# Patient Record
Sex: Male | Born: 1950 | Race: White | Hispanic: No | Marital: Married
Health system: Midwestern US, Academic
[De-identification: ages and names within clinical notes are randomized; demographics above are authoritative.]

---

## 2016-11-10 MED ORDER — CLOPIDOGREL 75 MG PO TAB
75 mg | ORAL_TABLET | Freq: Every day | ORAL | 3 refills | 90.00000 days | Status: DC
Start: 2016-11-10 — End: 2017-04-19

## 2016-11-23 MED ORDER — ACETAMINOPHEN 325 MG PO TAB
650 mg | ORAL | 0 refills | Status: DC | PRN
Start: 2016-11-23 — End: 2016-11-24

## 2016-11-23 MED ORDER — DIPHENHYDRAMINE HCL 50 MG/ML IJ SOLN
25 mg | INTRAVENOUS | 0 refills | Status: DC | PRN
Start: 2016-11-23 — End: 2016-11-24

## 2016-11-23 MED ORDER — NITROGLYCERIN 0.4 MG SL SUBL
.4 mg | SUBLINGUAL | 0 refills | Status: DC | PRN
Start: 2016-11-23 — End: 2016-11-24

## 2016-11-23 MED ORDER — ONDANSETRON HCL (PF) 4 MG/2 ML IJ SOLN
4 mg | INTRAVENOUS | 0 refills | Status: DC | PRN
Start: 2016-11-23 — End: 2016-11-24

## 2016-11-23 MED ORDER — ALUMINUM-MAGNESIUM HYDROXIDE 200-200 MG/5 ML PO SUSP
30 mL | ORAL | 0 refills | Status: DC | PRN
Start: 2016-11-23 — End: 2016-11-24

## 2016-11-23 MED ORDER — SODIUM CHLORIDE 0.9 % IV SOLP
INTRAVENOUS | 0 refills | Status: DC
Start: 2016-11-23 — End: 2016-11-24

## 2016-11-23 MED ORDER — ASPIRIN 325 MG PO TAB
325 mg | Freq: Once | ORAL | 0 refills | Status: DC
Start: 2016-11-23 — End: 2016-11-24

## 2016-11-23 MED ORDER — DIPHENHYDRAMINE HCL 25 MG PO CAP
25 mg | ORAL | 0 refills | Status: DC | PRN
Start: 2016-11-23 — End: 2016-11-24

## 2016-11-27 MED ORDER — CEFAZOLIN INJ 1GM IVP
2 g | Freq: Once | INTRAVENOUS | 0 refills | Status: CN
Start: 2016-11-27 — End: ?

## 2016-12-04 MED ORDER — PROPOFOL INJ 10 MG/ML IV VIAL
0 refills | Status: DC
Start: 2016-12-04 — End: 2016-12-04

## 2016-12-04 MED ORDER — HALOPERIDOL LACTATE 5 MG/ML IJ SOLN
1 mg | Freq: Once | INTRAVENOUS | 0 refills | Status: DC | PRN
Start: 2016-12-04 — End: 2016-12-04

## 2016-12-04 MED ORDER — LACTATED RINGERS IV SOLP
1000 mL | INTRAVENOUS | 0 refills | Status: DC
Start: 2016-12-04 — End: 2016-12-05

## 2016-12-04 MED ORDER — HEPARIN (PORCINE) 1,000 UNIT/ML IJ SOLN
0 refills | Status: DC
Start: 2016-12-04 — End: 2016-12-04

## 2016-12-04 MED ORDER — FENTANYL CITRATE (PF) 50 MCG/ML IJ SOLN
25 ug | INTRAVENOUS | 0 refills | Status: DC | PRN
Start: 2016-12-04 — End: 2016-12-04

## 2016-12-04 MED ORDER — ASPIRIN 325 MG PO TBEC
325 mg | Freq: Every evening | ORAL | 0 refills | Status: DC
Start: 2016-12-04 — End: 2016-12-06

## 2016-12-04 MED ORDER — CLOPIDOGREL 75 MG PO TAB
75 mg | Freq: Every day | ORAL | 0 refills | Status: DC
Start: 2016-12-04 — End: 2016-12-06

## 2016-12-04 MED ORDER — HEPARIN 5,000 UNITS IN LR 500 ML IRR
Freq: Once | 0 refills | Status: AC
Start: 2016-12-04 — End: ?

## 2016-12-04 MED ORDER — HYDROMORPHONE (PF) 2 MG/ML IJ SYRG
.5-1 mg | INTRAVENOUS | 0 refills | Status: DC | PRN
Start: 2016-12-04 — End: 2016-12-04

## 2016-12-04 MED ORDER — DEXAMETHASONE SODIUM PHOSPHATE 4 MG/ML IJ SOLN
INTRAVENOUS | 0 refills | Status: DC
Start: 2016-12-04 — End: 2016-12-04

## 2016-12-04 MED ORDER — PHENYLEPHRINE IN 0.9% NACL(PF) 1 MG/10 ML (100 MCG/ML) IV SYRG
INTRAVENOUS | 0 refills | Status: DC
Start: 2016-12-04 — End: 2016-12-04

## 2016-12-04 MED ORDER — ROSUVASTATIN 20 MG PO TAB
40 mg | Freq: Every evening | ORAL | 0 refills | Status: DC
Start: 2016-12-04 — End: 2016-12-06

## 2016-12-04 MED ORDER — FENTANYL CITRATE (PF) 50 MCG/ML IJ SOLN
25-50 ug | INTRAVENOUS | 0 refills | Status: DC | PRN
Start: 2016-12-04 — End: 2016-12-06

## 2016-12-04 MED ORDER — PROMETHAZINE 25 MG/ML IJ SOLN
6.25 mg | INTRAVENOUS | 0 refills | Status: DC | PRN
Start: 2016-12-04 — End: 2016-12-04

## 2016-12-04 MED ORDER — LIDOCAINE (PF) 10 MG/ML (1 %) IJ SOLN
.1-2 mL | INTRAMUSCULAR | 0 refills | Status: DC | PRN
Start: 2016-12-04 — End: 2016-12-06

## 2016-12-04 MED ORDER — DIPHENHYDRAMINE HCL 50 MG/ML IJ SOLN
25 mg | Freq: Once | INTRAVENOUS | 0 refills | Status: DC | PRN
Start: 2016-12-04 — End: 2016-12-04

## 2016-12-04 MED ORDER — BUPROPION XL 150 MG PO TB24
150 mg | Freq: Every day | ORAL | 0 refills | Status: DC
Start: 2016-12-04 — End: 2016-12-06

## 2016-12-04 MED ORDER — ENOXAPARIN 40 MG/0.4 ML SC SYRG
40 mg | Freq: Every day | SUBCUTANEOUS | 0 refills | Status: DC
Start: 2016-12-04 — End: 2016-12-06

## 2016-12-04 MED ORDER — ROCURONIUM 10 MG/ML IV SOLN
INTRAVENOUS | 0 refills | Status: DC
Start: 2016-12-04 — End: 2016-12-04

## 2016-12-04 MED ORDER — HYDROCODONE-ACETAMINOPHEN 5-325 MG PO TAB
1-2 | ORAL | 0 refills | Status: DC | PRN
Start: 2016-12-04 — End: 2016-12-06

## 2016-12-04 MED ORDER — LIDOCAINE (PF) 200 MG/10 ML (2 %) IJ SYRG
0 refills | Status: DC
Start: 2016-12-04 — End: 2016-12-04

## 2016-12-04 MED ORDER — FENTANYL CITRATE (PF) 50 MCG/ML IJ SOLN
0 refills | Status: DC
Start: 2016-12-04 — End: 2016-12-04

## 2016-12-04 MED ORDER — HEPARIN 5,000 UNITS IN LR 500 ML IRR
0 refills | Status: DC
Start: 2016-12-04 — End: 2016-12-04

## 2016-12-04 MED ORDER — PROTAMINE 10 MG/ML IV SOLN
0 refills | Status: DC
Start: 2016-12-04 — End: 2016-12-04

## 2016-12-04 MED ORDER — SUGAMMADEX 100 MG/ML IV SOLN
INTRAVENOUS | 0 refills | Status: DC
Start: 2016-12-04 — End: 2016-12-04

## 2016-12-04 MED ORDER — CEFAZOLIN 1GM NS IRR
0 refills | Status: DC
Start: 2016-12-04 — End: 2016-12-04

## 2016-12-04 MED ORDER — EPHEDRINE SULFATE 50 MG/ML IJ SOLN
0 refills | Status: DC
Start: 2016-12-04 — End: 2016-12-04

## 2016-12-04 MED ORDER — HYDROMORPHONE (PF) 2 MG/ML IJ SYRG
.5 mg | INTRAVENOUS | 0 refills | Status: DC | PRN
Start: 2016-12-04 — End: 2016-12-04

## 2016-12-04 MED ORDER — MIDAZOLAM 1 MG/ML IJ SOLN
INTRAVENOUS | 0 refills | Status: DC
Start: 2016-12-04 — End: 2016-12-04

## 2016-12-04 MED ORDER — ONDANSETRON HCL (PF) 4 MG/2 ML IJ SOLN
INTRAVENOUS | 0 refills | Status: DC
Start: 2016-12-04 — End: 2016-12-04

## 2016-12-04 MED ORDER — CEFAZOLIN INJ 1GM IVP
2 g | Freq: Once | INTRAVENOUS | 0 refills | Status: CP
Start: 2016-12-04 — End: ?

## 2016-12-06 MED ORDER — HYDROCODONE-ACETAMINOPHEN 5-325 MG PO TAB
1-2 | ORAL_TABLET | ORAL | 0 refills | 30.00000 days | Status: DC | PRN
Start: 2016-12-06 — End: 2016-12-11

## 2016-12-06 MED ORDER — BUPROPION SR 150 MG PO TBSR
150 mg | Freq: Every day | ORAL | 0 refills | Status: DC
Start: 2016-12-06 — End: 2018-11-12

## 2016-12-11 MED ORDER — HYDROCODONE-ACETAMINOPHEN 5-325 MG PO TAB
1-2 | ORAL_TABLET | ORAL | 0 refills | 15.00000 days | Status: DC | PRN
Start: 2016-12-11 — End: 2016-12-21

## 2016-12-11 MED ORDER — CEPHALEXIN 500 MG PO CAP
500 mg | ORAL_CAPSULE | Freq: Four times a day (QID) | ORAL | 0 refills | Status: AC
Start: 2016-12-11 — End: ?

## 2016-12-21 MED ORDER — HYDROCODONE-ACETAMINOPHEN 5-325 MG PO TAB
1 | ORAL_TABLET | ORAL | 0 refills | 30.00000 days | Status: DC | PRN
Start: 2016-12-21 — End: 2017-01-19

## 2017-01-19 ENCOUNTER — Encounter: Admit: 2017-01-19 | Discharge: 2017-01-19 | Payer: Commercial Managed Care - Pharmacy Benefit Manager

## 2017-01-19 ENCOUNTER — Ambulatory Visit: Admit: 2017-01-19 | Discharge: 2017-01-19 | Payer: BC Managed Care – PPO

## 2017-01-19 DIAGNOSIS — I739 Peripheral vascular disease, unspecified: Principal | ICD-10-CM

## 2017-01-19 DIAGNOSIS — Z951 Presence of aortocoronary bypass graft: ICD-10-CM

## 2017-01-19 DIAGNOSIS — E785 Hyperlipidemia, unspecified: ICD-10-CM

## 2017-01-19 DIAGNOSIS — R0600 Dyspnea, unspecified: ICD-10-CM

## 2017-01-19 DIAGNOSIS — R251 Tremor, unspecified: Principal | ICD-10-CM

## 2017-01-19 MED ORDER — HYDROCODONE-ACETAMINOPHEN 5-325 MG PO TAB
1 | ORAL_TABLET | ORAL | 0 refills | 30.00000 days | Status: AC | PRN
Start: 2017-01-19 — End: 2018-11-12
  Filled 2017-01-19 (×2): qty 25, 7d supply, fill #1

## 2017-01-19 NOTE — Progress Notes
Date of Service: 01/19/2017     Subjective:             Mitchell Krause is a 66 y.o. male.    History of Present Illness    ???  34M with CAD w/ inferior MI s/p CABG x5 in 2000, HTN, HLD, depression, active tobaccoism, and PVD w/ LLE claudication s/p AARO w/ Dr. Chales Abrahams 11/23/16 . Patient admitted 12/04/16 and underwent a Left proximal popliteal endarterectomy. Left distal popliteal artery and anterior tibial artery endarterectomy. Left proximal popliteal to tibioperoneal trunk bypass using non-reversed great saphenous vein graft for peripheral arterial disease with severely limiting left leg pain. He was evaluated and the left distal portion of the 3rd incision has a small amount of serous sang drainage.  He denies fevers or chills, SOB or CP. He is still having quite a bit of nerve pain. He says it is causing him to not sleep at night. He is walking as much as he can. The nerve pain can last as long as couple hours to 20 minutes. He find relief by walking or ice; or pain medication.      Review of Systems   HENT: Negative.    Eyes: Negative.    Respiratory: Negative.    Cardiovascular: Negative.    Gastrointestinal: Negative.    Endocrine: Negative.    Genitourinary: Negative.    Musculoskeletal: Positive for neck pain.   Skin: Negative.    Allergic/Immunologic: Negative.    Neurological: Positive for weakness and numbness.   Hematological: Bruises/bleeds easily.   Psychiatric/Behavioral: Negative.    All other systems reviewed and are negative.      Past Medical History:   Diagnosis Date   ??? Dyspnea    ??? History of five vessel coronary artery bypass    ??? Hyperlipidemia    ??? Tremor 09/01/2010    Left hand       Past Surgical History:   Procedure Laterality Date   ??? CORONARY ARTERY BYPASS GRAFT  2000    x5   ??? BYPASS GRAFT Left 12/04/2016    LEFT POPLITEAL ENDARTERECTOMY, LEFT ANTERIOR TIBIAL ENDARTERECTOMY, LEFT PROXIMAL POPLITEAL TO TIBIAL PERINEAL TRUNK BYPASS performed by Gladstone Lighter, MD [facility] ??? ABDOMINAL HERNIA REPAIR     ??? HX EYE SURGERY Left     Orbit replaced due to eye injury   ??? HX KNEE SURGERY Right    ??? HX ROTATOR CUFF REPAIR Left        Family History   Problem Relation Age of Onset   ??? High Cholesterol Father          Social History:   reports that he has been smoking Cigarettes.  He has a 21.50 pack-year smoking history. He has never used smokeless tobacco. He reports that he drinks about 1.2 oz of alcohol per week . He reports that he does not use drugs.      Objective:         ??? aspirin EC 325 mg tablet Take 325 mg by mouth at bedtime daily. Take with food.    ??? buPROPion SR(+) (WELLBUTRIN SR) 150 mg tablet Take 1 tablet by mouth daily.   ??? clopiDOGrel (PLAVIX) 75 mg tablet Take 1 tablet by mouth daily.   ??? Omega-3 Fatty Acids-Vitamin E (FISH OIL) 1,000 mg Cap Take 1 capsule by mouth at bedtime daily.   ??? potassium gluconate 600 mg (99 mg) tab Take 1 tablet by mouth every 72 hours.   ???  rosuvastatin (CRESTOR) 40 mg tablet Take once daily or as tolerated. (Patient taking differently: Take 40 mg by mouth at bedtime daily.)     Vitals:    01/19/17 0836   BP: 139/90   Pulse: 70   Resp: 14   Temp: 36.4 ???C (97.6 ???F)   TempSrc: Oral   Weight: 106.5 kg (234 lb 12.8 oz)   Height: 182.9 cm (72.01)     Body mass index is 31.84 kg/m???.     Physical Exam   Constitutional: He is oriented to person, place, and time. He appears well-developed and well-nourished.   Cardiovascular: Normal rate, regular rhythm, normal heart sounds and intact distal pulses.    Pulses:       Femoral pulses are 2+ on the right side, and 2+ on the left side.  Strong palpable graft pulse    Pulmonary/Chest: Effort normal and breath sounds normal.   Neurological: He is alert and oriented to person, place, and time. He has normal strength. GCS eye subscore is 4. GCS verbal subscore is 5. GCS motor subscore is 6.   Skin: Skin is warm and dry. Capillary refill takes less than 2 seconds. Psychiatric: He has a normal mood and affect. His speech is normal and behavior is normal. Thought content normal.   Nursing note and vitals reviewed.           Assessment and Plan:    [name] is a 66 y/o male that presents for follow up for LLE pop to tibio trunk bypass on 12/04/16. He is doing well. Graft [place] and ABIS done today; US revealed patent graft. Biphasic wave forms present; ABIS much improved since intervention L 1.01/0.77 R 1.06/0.61 compared to R 1.07/0.63 L 0.63/0.41. His left pain has resolved. He is still having quite a bit of nerve pain that extends to his foot. This is getting less and less. He is to continue asa and plavix and walking as tolerated. Some pain medication prescribed today; discussed weaning as tolerated. Will discuss nerve pain with Dr. Hollie Beach and see if there are any other alternatives. Would recommend ice as tolerated.       Suzan Slick, APRN  [facility] Services   Pager: [phone]

## 2017-04-19 ENCOUNTER — Encounter: Admit: 2017-04-19 | Discharge: 2017-04-19 | Payer: Commercial Managed Care - Pharmacy Benefit Manager

## 2017-04-19 ENCOUNTER — Ambulatory Visit: Admit: 2017-04-19 | Discharge: 2017-04-19 | Payer: BC Managed Care – PPO

## 2017-04-19 DIAGNOSIS — E7800 Pure hypercholesterolemia, unspecified: ICD-10-CM

## 2017-04-19 DIAGNOSIS — R251 Tremor, unspecified: Principal | ICD-10-CM

## 2017-04-19 DIAGNOSIS — E785 Hyperlipidemia, unspecified: ICD-10-CM

## 2017-04-19 DIAGNOSIS — I739 Peripheral vascular disease, unspecified: Principal | ICD-10-CM

## 2017-04-19 DIAGNOSIS — Z951 Presence of aortocoronary bypass graft: ICD-10-CM

## 2017-04-19 DIAGNOSIS — R0600 Dyspnea, unspecified: ICD-10-CM

## 2017-04-19 NOTE — Progress Notes
Date of Service: 04/19/2017     Subjective:             Mitchell Krause is a 66 y.o. male.    History of Present Illness  Mitchell Krause is a 66 y.o. male with a pmh of CAD s/p CABG, PVD s/p LLE pop to tibio trunk bypass on 12/04/16 who presents for 3 month f/u. He is having shooting radicular pain down his medial thigh, but is otherwise doing well. He is able to walk at least a mile and a half without claudication. He is taking ASA and is currently smoking. He stopped his plavix because he was bruising so significantly. Graft [place] and ABI's were done today and were within normal limits. He has had a cold, but no fevers.        Review of Systems   Constitutional: Negative.    HENT: Negative.    Eyes: Negative.    Respiratory: Negative.    Cardiovascular: Negative.    Gastrointestinal: Negative.    Endocrine: Negative.    Genitourinary: Negative.    Musculoskeletal: Negative.    Skin: Negative.    Allergic/Immunologic: Negative.    Neurological: Positive for numbness.   Hematological: Negative.    Psychiatric/Behavioral: Negative.    All other systems reviewed and are negative.      Past Medical History:   Diagnosis Date   ??? Dyspnea    ??? History of five vessel coronary artery bypass    ??? Hyperlipidemia    ??? Tremor 09/01/2010    Left hand       Past Surgical History:   Procedure Laterality Date   ??? CORONARY ARTERY BYPASS GRAFT  2000    x5   ??? BYPASS GRAFT Left 12/04/2016    LEFT POPLITEAL ENDARTERECTOMY, LEFT ANTERIOR TIBIAL ENDARTERECTOMY, LEFT PROXIMAL POPLITEAL TO TIBIAL PERINEAL TRUNK BYPASS performed by Gladstone Lighter, MD [facility]   ??? ABDOMINAL HERNIA REPAIR     ??? HX EYE SURGERY Left     Orbit replaced due to eye injury   ??? HX KNEE SURGERY Right    ??? HX ROTATOR CUFF REPAIR Left        Family History   Problem Relation Age of Onset   ??? High Cholesterol Father          Social History:   reports that he has been smoking Cigarettes.  He has a 21.50 pack-year smoking history. He has never used smokeless tobacco. He reports that he drinks about 1.2 oz of alcohol per week . He reports that he does not use drugs.      Objective:         ??? aspirin EC 325 mg tablet Take 325 mg by mouth at bedtime daily. Take with food.    ??? buPROPion SR(+) (WELLBUTRIN SR) 150 mg tablet Take 1 tablet by mouth daily.   ??? HYDROcodone/acetaminophen (NORCO) 5/325 mg tablet Take 1 tablet by mouth every 6 hours as needed   ??? Omega-3 Fatty Acids-Vitamin E (FISH OIL) 1,000 mg Cap Take 1 capsule by mouth at bedtime daily.   ??? potassium gluconate 600 mg (99 mg) tab Take 1 tablet by mouth every 72 hours.   ??? rosuvastatin (CRESTOR) 40 mg tablet Take once daily or as tolerated. (Patient taking differently: Take 40 mg by mouth at bedtime daily.)     Vitals:    04/19/17 1456   BP: 121/75   Pulse: 73   Resp: 14   Temp:  36.7 ???C (98 ???F)   TempSrc: Oral   Weight: 104.9 kg (231 lb 3.2 oz)   Height: 180.3 cm (71)     Body mass index is 32.25 kg/m???.     Physical Exam   Constitutional: He is oriented to person, place, and time. He appears well-developed and well-nourished. No distress.   HENT:   Head: Normocephalic and atraumatic.   Cardiovascular: Normal rate and intact distal pulses.    Pulses:       Femoral pulses are 2+ on the right side, and 2+ on the left side.       Dorsalis pedis pulses are 2+ on the right side, and 2+ on the left side.        Posterior tibial pulses are 2+ on the right side, and 2+ on the left side.   Strong palpable graft pulse, doppler signals in DP/PT bilaterally   Pulmonary/Chest: Effort normal and breath sounds normal.   Neurological: He is alert and oriented to person, place, and time. He has normal strength.   Skin: Skin is warm and dry. Capillary refill takes less than 2 seconds.        Psychiatric: He has a normal mood and affect. His speech is normal and behavior is normal. Thought content normal.   Nursing note and vitals reviewed.    01/19/2017 - ABIs  R - 1.06  L - 1.01 04/19/2017 - ABIs  R - 0.98  L - 0.98       Assessment and Plan:  Mitchell Krause is a 66 y.o. male with a pmh of CAD s/p CABG, PVD s/p LLE pop to tibio trunk bypass on 12/04/16 who presents for 3 month f/u.    - doing well today, counseled that the nerve pain should continue to subside over time  - ABIs within normal limits, graft ultrasound with patent graft  - rtc in 3 months with ABIs and graft ultrasound    Sean C. Liebscher, MD     ATTESTATION    I personally performed the key portions of the E/M visit, discussed case with resident and concur with resident documentation of history, physical exam, assessment, and treatment plan unless otherwise noted.     He is a 66 year old male with hypercholesterolemia and peripheral arterial disease who had a left popliteal to tibial artery bypass graft done approximately 3 months ago.  He has some incisional discomfort, but denies any residual leg pain with walking.  His incisions are intact.  He is a good pulse within the bypass.  His ankle-brachial index was 0.98 bilaterally with normal biphasic Doppler waveforms.  The ultrasound showed no evidence of bypass graft narrowing.      He may continue activity as tolerated.  I recommend a follow-up bypass ultrasound in 3 months unless new leg pain arises in the interim.  Unfortunately, he continues to smoke.  I have encouraged him in efforts toward smoking cessation.  We discussed options for assistance in smoking cessation.    Staff name:  Gladstone Lighter, MD Date:  04/29/2017

## 2017-11-01 ENCOUNTER — Encounter: Admit: 2017-11-01 | Discharge: 2017-11-01 | Payer: Commercial Managed Care - Pharmacy Benefit Manager

## 2017-11-01 DIAGNOSIS — R251 Tremor, unspecified: Principal | ICD-10-CM

## 2017-11-01 DIAGNOSIS — E785 Hyperlipidemia, unspecified: ICD-10-CM

## 2017-11-01 DIAGNOSIS — R0600 Dyspnea, unspecified: ICD-10-CM

## 2017-11-01 DIAGNOSIS — Z951 Presence of aortocoronary bypass graft: ICD-10-CM

## 2018-05-07 ENCOUNTER — Ambulatory Visit: Admit: 2018-05-07 | Discharge: 2018-05-08 | Payer: BC Managed Care – PPO

## 2018-05-07 ENCOUNTER — Encounter: Admit: 2018-05-07 | Discharge: 2018-05-07 | Payer: Commercial Managed Care - Pharmacy Benefit Manager

## 2018-05-07 DIAGNOSIS — Z951 Presence of aortocoronary bypass graft: ICD-10-CM

## 2018-05-07 DIAGNOSIS — Z789 Other specified health status: ICD-10-CM

## 2018-05-07 DIAGNOSIS — E785 Hyperlipidemia, unspecified: ICD-10-CM

## 2018-05-07 DIAGNOSIS — E7800 Pure hypercholesterolemia, unspecified: ICD-10-CM

## 2018-05-07 DIAGNOSIS — R0600 Dyspnea, unspecified: ICD-10-CM

## 2018-05-07 DIAGNOSIS — I739 Peripheral vascular disease, unspecified: ICD-10-CM

## 2018-05-07 DIAGNOSIS — I25810 Atherosclerosis of coronary artery bypass graft(s) without angina pectoris: ICD-10-CM

## 2018-05-07 DIAGNOSIS — R251 Tremor, unspecified: Principal | ICD-10-CM

## 2018-05-07 DIAGNOSIS — I2510 Atherosclerotic heart disease of native coronary artery without angina pectoris: Principal | ICD-10-CM

## 2018-05-07 MED ORDER — ROSUVASTATIN 5 MG PO TAB
ORAL_TABLET | Freq: Every day | ORAL | 3 refills | 90.00000 days | Status: AC
Start: 2018-05-07 — End: 2019-06-24

## 2018-05-07 MED ORDER — EVOLOCUMAB 140 MG/ML SC PNIJ
140 mg | SUBCUTANEOUS | 11 refills | 28.00000 days | Status: AC
Start: 2018-05-07 — End: 2018-06-13

## 2018-05-13 ENCOUNTER — Encounter: Admit: 2018-05-13 | Discharge: 2018-05-13 | Payer: Commercial Managed Care - Pharmacy Benefit Manager

## 2018-05-13 DIAGNOSIS — I25810 Atherosclerosis of coronary artery bypass graft(s) without angina pectoris: Principal | ICD-10-CM

## 2018-05-14 LAB — LIPID PROFILE
Lab: 183
Lab: 213 — ABNORMAL HIGH (ref <100)
Lab: 250 — ABNORMAL HIGH (ref 150–200)
Lab: 37
Lab: 40
Lab: 6 — ABNORMAL HIGH (ref 0–5)

## 2018-05-15 ENCOUNTER — Encounter: Admit: 2018-05-15 | Discharge: 2018-05-15 | Payer: Commercial Managed Care - Pharmacy Benefit Manager

## 2018-05-15 DIAGNOSIS — I25810 Atherosclerosis of coronary artery bypass graft(s) without angina pectoris: Principal | ICD-10-CM

## 2018-06-13 ENCOUNTER — Encounter: Admit: 2018-06-13 | Discharge: 2018-06-13 | Payer: Commercial Managed Care - Pharmacy Benefit Manager

## 2018-06-13 MED ORDER — EVOLOCUMAB 140 MG/ML SC PNIJ
140 mg | SUBCUTANEOUS | 11 refills | 28.00000 days | Status: AC
Start: 2018-06-13 — End: 2019-06-24

## 2018-06-14 ENCOUNTER — Encounter: Admit: 2018-06-14 | Discharge: 2018-06-14 | Payer: Commercial Managed Care - Pharmacy Benefit Manager

## 2018-06-19 ENCOUNTER — Encounter: Admit: 2018-06-19 | Discharge: 2018-06-19 | Payer: Commercial Managed Care - Pharmacy Benefit Manager

## 2018-06-21 ENCOUNTER — Encounter: Admit: 2018-06-21 | Discharge: 2018-06-21 | Payer: Commercial Managed Care - Pharmacy Benefit Manager

## 2018-06-25 ENCOUNTER — Encounter: Admit: 2018-06-25 | Discharge: 2018-06-25 | Payer: Commercial Managed Care - Pharmacy Benefit Manager

## 2018-07-03 ENCOUNTER — Encounter: Admit: 2018-07-03 | Discharge: 2018-07-03 | Payer: Commercial Managed Care - Pharmacy Benefit Manager

## 2018-08-01 ENCOUNTER — Encounter: Admit: 2018-08-01 | Discharge: 2018-08-01 | Payer: Commercial Managed Care - Pharmacy Benefit Manager

## 2018-08-01 DIAGNOSIS — E7800 Pure hypercholesterolemia, unspecified: Secondary | ICD-10-CM

## 2018-08-20 ENCOUNTER — Encounter: Admit: 2018-08-20 | Discharge: 2018-08-20 | Payer: Commercial Managed Care - Pharmacy Benefit Manager

## 2018-09-10 ENCOUNTER — Encounter: Admit: 2018-09-10 | Discharge: 2018-09-10 | Payer: Commercial Managed Care - Pharmacy Benefit Manager

## 2018-10-09 ENCOUNTER — Encounter: Admit: 2018-10-09 | Discharge: 2018-10-09 | Payer: Commercial Managed Care - Pharmacy Benefit Manager

## 2018-10-09 DIAGNOSIS — E7800 Pure hypercholesterolemia, unspecified: Principal | ICD-10-CM

## 2018-10-09 LAB — LIPID PROFILE
Lab: 130
Lab: 39 — ABNORMAL LOW (ref >=40)
Lab: 150
Lab: 26
Lab: 4
Lab: 85

## 2018-11-08 ENCOUNTER — Encounter: Admit: 2018-11-08 | Discharge: 2018-11-08 | Payer: Commercial Managed Care - Pharmacy Benefit Manager

## 2018-11-12 ENCOUNTER — Ambulatory Visit: Admit: 2018-11-12 | Discharge: 2018-11-13 | Payer: BC Managed Care – PPO

## 2018-11-12 ENCOUNTER — Encounter: Admit: 2018-11-12 | Discharge: 2018-11-12 | Payer: Commercial Managed Care - Pharmacy Benefit Manager

## 2018-11-12 DIAGNOSIS — Z789 Other specified health status: ICD-10-CM

## 2018-11-12 DIAGNOSIS — E7800 Pure hypercholesterolemia, unspecified: ICD-10-CM

## 2018-11-12 DIAGNOSIS — E785 Hyperlipidemia, unspecified: ICD-10-CM

## 2018-11-12 DIAGNOSIS — Z951 Presence of aortocoronary bypass graft: ICD-10-CM

## 2018-11-12 DIAGNOSIS — I739 Peripheral vascular disease, unspecified: ICD-10-CM

## 2018-11-12 DIAGNOSIS — R251 Tremor, unspecified: Principal | ICD-10-CM

## 2018-11-12 DIAGNOSIS — I2510 Atherosclerotic heart disease of native coronary artery without angina pectoris: Principal | ICD-10-CM

## 2018-11-12 DIAGNOSIS — R0600 Dyspnea, unspecified: ICD-10-CM

## 2018-11-12 NOTE — Progress Notes
Date of Service: 11/12/2018    Mitchell Krause is a 68 y.o. male.       HPI     I saw Mitchell Krause shows today for a telehealth visit using a Zoom HIPAA compliant connection.  A/V visual audio and video, connections were good.  He reports that he has taken 3 months of the Repatha 2 treatments a month.  He had some memory deficit after the first 2 months but this resolved when he started taking co-Q10 100 mg daily.  He read that that this is cognitive change could follow Repatha and that co-Q10 was helpful.  Now he is very satisfied with the tolerance of the drug.  He wanted to know what his lipid profile was as it was not in EMR.  I reviewed his lipids and saw that his HDL was unchanged at 39 was it had been 40.  Edd Arbour is a nurse who is helping me with the call I checked and found that the result was favorable with an 8 LDL of 85.      He is tried every pharmacologic and psychological electric known to here him with continuation of smoking 5 or 10 cigarettes a day.     His blood pressure is generally in the 08/24/1958 range and as never carried a diagnosis of hypertension.  .   He has walking 2.5 miles/day at good pace and doing yardworrk. He denies having typical symptoms suggesting the presence of angina, heart failure, TIA, claudication or arrhythmias.           Vitals:    11/12/18 0836   BP: 128/62   BP Source: Arm, Left Upper   Pulse: 72   Weight: 104.3 kg (230 lb)   Height: 1.829 m (6')   PainSc: Zero     Body mass index is 31.19 kg/m???.     Past Medical History  Patient Active Problem List    Diagnosis Date Noted   ??? 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  Specifics of reactions are listed in the allergy section.  L.     ??? PVD (peripheral vascular disease) (HCC) 12/04/2016   ??? PAD (peripheral artery disease) (HCC) 11/10/2016   ??? Coronary artery disease involving autologous vein bypass graft 09/15/2016

## 2018-12-31 ENCOUNTER — Encounter: Admit: 2018-12-31 | Discharge: 2018-12-31

## 2019-05-20 ENCOUNTER — Encounter: Admit: 2019-05-20 | Discharge: 2019-05-20 | Payer: Commercial Managed Care - Pharmacy Benefit Manager

## 2019-05-20 MED ORDER — REPATHA SYRINGE 140 MG/ML SC SYRG
SUBCUTANEOUS | 5 refills | 28.00000 days | Status: DC
Start: 2019-05-20 — End: 2019-08-27

## 2019-06-24 ENCOUNTER — Encounter: Admit: 2019-06-24 | Discharge: 2019-06-24 | Payer: Commercial Managed Care - Pharmacy Benefit Manager

## 2019-06-24 DIAGNOSIS — E785 Hyperlipidemia, unspecified: Secondary | ICD-10-CM

## 2019-06-24 DIAGNOSIS — I25810 Atherosclerosis of coronary artery bypass graft(s) without angina pectoris: Secondary | ICD-10-CM

## 2019-06-24 DIAGNOSIS — R251 Tremor, unspecified: Secondary | ICD-10-CM

## 2019-06-24 DIAGNOSIS — E7800 Pure hypercholesterolemia, unspecified: Secondary | ICD-10-CM

## 2019-06-24 DIAGNOSIS — Z789 Other specified health status: Secondary | ICD-10-CM

## 2019-06-24 DIAGNOSIS — R0600 Dyspnea, unspecified: Secondary | ICD-10-CM

## 2019-06-24 DIAGNOSIS — I2510 Atherosclerotic heart disease of native coronary artery without angina pectoris: Secondary | ICD-10-CM

## 2019-06-24 DIAGNOSIS — F17210 Nicotine dependence, cigarettes, uncomplicated: Secondary | ICD-10-CM

## 2019-06-24 DIAGNOSIS — Z951 Presence of aortocoronary bypass graft: Secondary | ICD-10-CM

## 2019-06-24 DIAGNOSIS — Z95828 Presence of other vascular implants and grafts: Secondary | ICD-10-CM

## 2019-06-24 MED ORDER — ROSUVASTATIN 5 MG PO TAB
ORAL_TABLET | Freq: Every day | ORAL | 3 refills | 90.00000 days | Status: AC
Start: 2019-06-24 — End: ?

## 2019-06-24 NOTE — Progress Notes
Date of Service: 06/24/2019    [name] is a 68 y.o. male.       HPI      Mitchell Krause returns today for follow-up evaluation of known coronary artery disease.   He has had no new major medical problems develop since I have seen him last. He has histor of CAD with bypass and PAD with left leg revascuarlariztion. Due to statin intolerance he is on Repatha for lipid control     [name] does everything he wants to do with no chest pain or new shortness of breath or reduction in exercise capacity.  We have done stress test on him in the past principally because of some shortness of breath not anything that sounds like typical angina.  We discussed his last stress test from 2018 that showed a very limited area of inferior injury with normal ejection fraction.  The perfusion study study report was similar to what was reported in 2016 with a inferior defect.  He has had no   deterioration in his functional status since then.    Mitchell Krause had total resolution of symptom limiting claudication with complex arterial surgery with popliteal to tibial peroneal bypass with endarterectomies in May 2018 at [facility] by Dr. Gladstone Lighter.  Previously he had been significantly limited by claudication.  He is now doing everything he wants to do with no recurrence of the leg pain.    Unfortunately Mitchell Krause has not been able to completely stop smoking in spite of admonitions by virtually every doctor he seen in the last 10 years including me.  He uses patches when he is really stressed and feels vulnerable to smoking far too many cigarettes.  He has tried Wellbutrin and Chantix without success.    He had an echocardiogram in 2018 as well.  That showed EF 55% with no valve disease or other structural abnormality.    He recently had Cortison injection after right  shoulder repair in 2018 with systolic blood pressure  prior to injection at 116.         Vitals:    06/24/19 0859 06/24/19 0913   BP: 132/84 (!) 146/82 BP Source: Arm, Left Upper Arm, Right Upper   Pulse: 76    Temp: 36.7 ?C (98 ?F)    SpO2: 98%    Weight: 107.3 kg (236 lb 9.6 oz)    Height: 1.829 m (6')    PainSc: Zero      Body mass index is 32.09 kg/m?Mitchell Krause     Past Medical History  Patient Active Problem List    Diagnosis Date Noted   ? 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  Specifics of reactions are listed in the allergy section.  L.     ? PVD (peripheral vascular disease) (HCC) 12/04/2016   ? PAD (peripheral artery disease) (HCC) 11/10/2016   ? Coronary artery disease involving autologous vein bypass graft 09/15/2016   ? Coronary atherosclerosis due to calcified coronary lesion of native artery 02/23/2015     A. 10/95-Inferior MI, Failed t-PA;Cath:99% RCA dilated, mod disease elsewhere  B. 3/00-Exercise echo:nondiagnostic due to low level of stress achieved  C. 3/00- Cath: 60% L[place], severe RCA, OM, Dx, Mod LAD p + thall w/ post ischemia   D. 11/04/98-CABGx5:LIMA-LAD, SVG-Intermed, SeqSVG-OM-OM, [place]  i. 6/07 [facility]: EF 50% no ischemia/infarct  >09/06/10  9 min Bruce  Exercise thall: EF 54%, Normal perfusion, 110% HR     ? Family history of heart disease in male family member before age 70 09/01/2010   ? Claudication (HCC) 09/01/2010   ? Hypercholesteremia 09/01/2010     Patient had memory loss with statins in the past and does not wish to increase Crestor dose.  01/2015  LDL 196 dropped to the 80s and 90s on statin 2016-2018   10/19 Off Crestor d.t severe myalgias, Could only tolerate 10 days q 2 weeks.      ? 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      ? Dyspnea 09/01/2010     09/06/10-Bruce Thallium MPI:  EF 57%.  Normal perfusion, No infarct ischemia. Good functional capacity with no symptoms suggestive of angina. 09/06/10-Echo: EF 60%.  Normal walls &  chamber sizes.  Valves OK,  No pericardial effusion.  Insufficient TR to determine PAP.            Review of Systems   Constitution: Negative.   HENT: Positive for congestion and tinnitus.    Eyes: Negative.    Cardiovascular: Negative.    Respiratory: Negative.    Endocrine: Negative.    Hematologic/Lymphatic: Bruises/bleeds easily.   Skin: Negative.    Musculoskeletal: Positive for arthritis, back pain and joint pain.   Gastrointestinal: Negative.    Genitourinary: Negative.    Neurological: Negative.    Psychiatric/Behavioral: Positive for memory loss.   Allergic/Immunologic: Positive for environmental allergies.   14 organ system review noted. It is negative except as reported in current narrative or above in the ROS section. This is a patient centered review of systems that was stated by the patient in his terms prior to my personal problem oriented interview with the patient      Physical Exam  General: Patient in no distress, looks generally healthy. Skin warm and dry, non icteric,    Mucous membranes moist.  Pupils equal and round Wearing glasses   Carotids: no bruits    Thyroid not enlarged.  Neck veins: CVP <6 normal, no V wave, no HJR     Respiratory: Breathing comfortably. Lungs clear to percussion & auscultation. No rales, rhonchi or wheezing   Cardiac: Regular rhythm. LV impulse not palpable. Normal S1 & S2, Fourth heart sound, no rub or S3. No murmur  Abdomen: soft, non-tender, no masses,bruits,hepatic or aortic enlargement. + bowel sounds.   Femoral arteries: Good pulses, no bruits.  Legs/feet: Normal PT pulse right, 1+ Left,  no edema.   Motor: Normal muscle strength. Cognitive: Pleasant demeanor. Good insight. No depression     Cardiovascular Studies  EKG today shows sinus rhythm rate 64.  Late transition but computer calls a normal.    Problems Addressed Today  No diagnosis found.    Assessment and Plan Mitchell Krause is really great beneficiary of medical technology and drug development.  He has had bypass surgery more than 20 years ago with no recurrence of any symptoms suggesting angina and full activity.  He had significant claudication that has completely resolved and not recurred since his May 2018 above to below the knee bypass with endarterectomies.  His cardiac function has always been normal.  He has had dyspnea that is not been associated with recurrent ischemia or heart failure.  I suspect it may be COPD worsened when he is smoking more as the cause of  his symptoms in 2016.  With his increased walking ability he has less issues with deconditioning that also might have been contributing to shortness of breath.    He is still doing his best to stop smoking and is down to about 5 cigarettes cigarettes a day with nicotine patch use.  Hopefully he will be able to eradicate the habit someday as he is not given up on the hope of becoming smoke-free.    His LDL cholesterol has dropped dramatically with Repatha and very low-dose well-tolerated rosuvastatin.    I do not think he needs any testing right now.  In the absence of any suggestion of exercise intolerance I do not think we need routine stress test or echo.  Similarly I do not think we need any scans for his peripheral perfusion on the left as he is asymptomatic and active.  Is    His blood pressure is a little higher today than almost all of his other readings.  He had a reading of 116 when he was in for pain injection from limited limited success after shoulder repair.  I do not think he should augment his medications with antihypertensives at this time.    I do not think I need to see him more often than annually as long as everything stays the same.  He knows to contact me if he develops palpitations or chest pain or pressure of any sort with exertion or new shortness of breath    .iiii   Patient Instructions You may feel a little strange the day after the Repatha but with a drop in your bad cholesterol from 2 13-85 that miraculous drop in cholesterol that was unavailable to you with any other treatment is worth the minor annoyance.  Your triglycerides need to be under 150 and they dropped from 1 83-1 30.  Your good cholesterol the HDL stayed unchanged.  This is basically an incredibly good result with twice a month injection.  It is really good that you can tolerate the rosuvastatin/Crestor at 5 mg daily.  The Crestor increases the power of the Repatha.  I am now trying some people who really cannot take statins to just take the Crestor 1 day a week to see if that will help boost the power of the Repatha.  Your results are terrific.   05/14/2018  10/09/2018    Cholesterol 250 (H) 150   Triglycerides 183 130   HDL 40 39 (L)   LDL 213 (H) 85     You do not need tests on your heart blood flow or your legs as you are asymptomatic and active.    I do not have any new ideas for how to stop smoking.  Just do not give up on the idea that you could finally stop.    Track your blood pressure.  Make sure you know that most of the time it is under 130/80.  The occasional high readings in doctor's offices can be ignored as long as you have other readings that look good most of the time.Here are your blood pressure goals:   Systolic (top number): Over 90-100 minimum, usually 110-130. Occasionally approaching 140,  no 150s.  Diastolic (bottom number):  80 or less, rarely could accept 85-90.     Return to see me for a recheck in one year.   I can see you sooner if needed.   Call in if you have problems or questions.   Marissa Nestle, MD  Current Medications (including today's revisions)  ? aspirin EC 81 mg tablet Take 81 mg by mouth at bedtime daily. Take with food.    ? coenzyme Q10 100 mg cap Take 100 mg by mouth daily. ? fluticasone propionate (FLONASE) 50 mcg/actuation nasal spray, suspension Apply 2 sprays to each nostril as directed daily.   ? HYDROcodone/acetaminophen (NORCO) 7.5/325 mg tablet Take 1 tablet by mouth as Needed   ? Omega-3 Fatty Acids-Vitamin E (FISH OIL) 1,000 mg Cap Take 1 capsule by mouth at bedtime daily.   ? potassium gluconate 600 mg (99 mg) tab Take 1 tablet by mouth as Needed.   ? REPATHA SYRINGE 140 mg/mL injectable SYRINGE INJECT UNDER THE SKIN EVERY 14 DAYS   ? rosuvastatin (CRESTOR) 5 mg tablet one daily or as directed   ? SPIRIVA RESPIMAT 2.5 mcg/actuation inhaler Inhale 1 puff by mouth into the lungs daily.   ? SYMBICORT 160-4.5 mcg/actuation inhalation Inhale 1-2 puffs by mouth into the lungs twice daily.

## 2019-07-09 ENCOUNTER — Encounter: Admit: 2019-07-09 | Discharge: 2019-07-09 | Payer: Commercial Managed Care - Pharmacy Benefit Manager

## 2019-07-09 NOTE — Progress Notes
PA submitted to Covermymeds for Repatha.  Approval pending.     Coverm[employer]eds [phone]

## 2019-08-19 ENCOUNTER — Encounter: Admit: 2019-08-19 | Discharge: 2019-08-19 | Payer: Commercial Managed Care - Pharmacy Benefit Manager

## 2019-08-19 DIAGNOSIS — I25810 Atherosclerosis of coronary artery bypass graft(s) without angina pectoris: Secondary | ICD-10-CM

## 2019-08-19 DIAGNOSIS — Z789 Other specified health status: Secondary | ICD-10-CM

## 2019-08-19 DIAGNOSIS — E7800 Pure hypercholesterolemia, unspecified: Secondary | ICD-10-CM

## 2019-08-25 ENCOUNTER — Encounter: Admit: 2019-08-25 | Discharge: 2019-08-25 | Payer: Commercial Managed Care - Pharmacy Benefit Manager

## 2019-08-25 DIAGNOSIS — I25810 Atherosclerosis of coronary artery bypass graft(s) without angina pectoris: Secondary | ICD-10-CM

## 2019-08-25 DIAGNOSIS — E7800 Pure hypercholesterolemia, unspecified: Secondary | ICD-10-CM

## 2019-08-25 DIAGNOSIS — Z789 Other specified health status: Secondary | ICD-10-CM

## 2019-08-25 LAB — LIPID PROFILE
Lab: 161 — ABNORMAL HIGH (ref <100)
Lab: 185 — ABNORMAL HIGH (ref <150)
Lab: 238 — ABNORMAL HIGH (ref <200)
Lab: 40
Lab: 6 — ABNORMAL HIGH (ref 0–5)
Lab: 37

## 2019-08-25 NOTE — Progress Notes
The Prior Authorization for Repatha was submitted for Mitchell Krause via Cover My Meds.  Will continue to follow.    Mitchell Krause  Pharmacy Patient Advocate  [phone]

## 2019-08-27 ENCOUNTER — Encounter: Admit: 2019-08-27 | Discharge: 2019-08-27 | Payer: Commercial Managed Care - Pharmacy Benefit Manager

## 2019-08-27 MED ORDER — REPATHA SYRINGE 140 MG/ML SC SYRG
140 mg | SUBCUTANEOUS | 11 refills | 28.00000 days | Status: AC
Start: 2019-08-27 — End: ?

## 2019-08-28 ENCOUNTER — Encounter: Admit: 2019-08-28 | Discharge: 2019-08-28 | Payer: Commercial Managed Care - Pharmacy Benefit Manager

## 2020-03-30 ENCOUNTER — Encounter: Admit: 2020-03-30 | Discharge: 2020-03-30 | Payer: Commercial Managed Care - Pharmacy Benefit Manager

## 2020-03-30 DIAGNOSIS — I2510 Atherosclerotic heart disease of native coronary artery without angina pectoris: Secondary | ICD-10-CM

## 2020-03-30 DIAGNOSIS — E7800 Pure hypercholesterolemia, unspecified: Secondary | ICD-10-CM

## 2020-04-22 ENCOUNTER — Encounter: Admit: 2020-04-22 | Discharge: 2020-04-22 | Payer: Commercial Managed Care - Pharmacy Benefit Manager

## 2020-04-22 DIAGNOSIS — I2510 Atherosclerotic heart disease of native coronary artery without angina pectoris: Secondary | ICD-10-CM

## 2020-04-22 DIAGNOSIS — E7800 Pure hypercholesterolemia, unspecified: Secondary | ICD-10-CM

## 2020-04-22 LAB — LIPID PROFILE
Lab: 26
Lab: 3
Lab: 139 K/UL (ref 1.0–4.8)
Lab: 53 K/UL (ref 0–0.45)
Lab: 60 10*3/uL (ref 0–0.20)

## 2020-08-23 ENCOUNTER — Encounter: Admit: 2020-08-23 | Discharge: 2020-08-23 | Payer: Commercial Managed Care - Pharmacy Benefit Manager

## 2020-08-23 MED ORDER — REPATHA SYRINGE 140 MG/ML SC SYRG
140 mg | SUBCUTANEOUS | 11 refills | 28.00000 days | Status: AC
Start: 2020-08-23 — End: ?

## 2020-08-24 ENCOUNTER — Encounter: Admit: 2020-08-24 | Discharge: 2020-08-24 | Payer: Commercial Managed Care - Pharmacy Benefit Manager

## 2020-08-24 NOTE — Telephone Encounter
First PA for Repatha was denied.  Faxed Appeal paperwork.  Will await approval from insurance.

## 2020-08-24 NOTE — Telephone Encounter
Filled out appeal paperwork. Awaiting response from insurance. Ref# [id number]

## 2020-09-02 ENCOUNTER — Encounter: Admit: 2020-09-02 | Discharge: 2020-09-02 | Payer: Commercial Managed Care - Pharmacy Benefit Manager

## 2020-09-02 DIAGNOSIS — Z789 Other specified health status: Secondary | ICD-10-CM

## 2020-09-02 DIAGNOSIS — E7800 Pure hypercholesterolemia, unspecified: Secondary | ICD-10-CM

## 2020-09-02 DIAGNOSIS — Z95828 Presence of other vascular implants and grafts: Secondary | ICD-10-CM

## 2020-09-02 DIAGNOSIS — E785 Hyperlipidemia, unspecified: Secondary | ICD-10-CM

## 2020-09-02 DIAGNOSIS — Z720 Tobacco use: Secondary | ICD-10-CM

## 2020-09-02 DIAGNOSIS — I2510 Atherosclerotic heart disease of native coronary artery without angina pectoris: Secondary | ICD-10-CM

## 2020-09-02 DIAGNOSIS — Z951 Presence of aortocoronary bypass graft: Secondary | ICD-10-CM

## 2020-09-02 DIAGNOSIS — M79604 Pain in right leg: Secondary | ICD-10-CM

## 2020-09-02 DIAGNOSIS — R251 Tremor, unspecified: Secondary | ICD-10-CM

## 2020-09-02 DIAGNOSIS — Z8249 Family history of ischemic heart disease and other diseases of the circulatory system: Secondary | ICD-10-CM

## 2020-09-02 DIAGNOSIS — R0600 Dyspnea, unspecified: Secondary | ICD-10-CM

## 2020-09-02 DIAGNOSIS — I25810 Atherosclerosis of coronary artery bypass graft(s) without angina pectoris: Secondary | ICD-10-CM

## 2020-09-02 DIAGNOSIS — I739 Peripheral vascular disease, unspecified: Secondary | ICD-10-CM

## 2020-09-02 MED ORDER — ROSUVASTATIN 5 MG PO TAB
ORAL_TABLET | Freq: Every day | ORAL | 3 refills | 90.00000 days | Status: AC
Start: 2020-09-02 — End: ?
  Filled 2020-11-30: qty 30, 30d supply, fill #1

## 2020-09-03 ENCOUNTER — Encounter: Admit: 2020-09-03 | Discharge: 2020-09-03 | Payer: Commercial Managed Care - Pharmacy Benefit Manager

## 2020-09-13 NOTE — Progress Notes
Prior authorization submitted to [email or web address] for praluent-awaiting decision

## 2020-10-06 ENCOUNTER — Encounter: Admit: 2020-10-06 | Discharge: 2020-10-06 | Payer: Commercial Managed Care - Pharmacy Benefit Manager

## 2020-10-06 MED ORDER — REPATHA SYRINGE 140 MG/ML SC SYRG
140 mg | SUBCUTANEOUS | 3 refills | 28.00000 days | Status: AC
Start: 2020-10-06 — End: ?

## 2020-11-26 ENCOUNTER — Encounter: Admit: 2020-11-26 | Discharge: 2020-11-26 | Payer: Commercial Managed Care - Pharmacy Benefit Manager

## 2020-11-26 DIAGNOSIS — I214 Non-ST elevation (NSTEMI) myocardial infarction: Secondary | ICD-10-CM

## 2020-11-26 MED ORDER — HEPARIN (PORCINE) 1,000 UNIT/ML IJ SOLN
4000 [IU] | Freq: Once | INTRAVENOUS | 0 refills | Status: AC
Start: 2020-11-26 — End: ?

## 2020-11-26 MED ORDER — ASPIRIN 325 MG PO TAB
325 mg | Freq: Once | ORAL | 0 refills | Status: AC
Start: 2020-11-26 — End: ?

## 2020-11-26 MED ORDER — BUDESONIDE-FORMOTEROL 160-4.5 MCG/ACTUATION IN HFAA
2 | Freq: Two times a day (BID) | RESPIRATORY_TRACT | 0 refills | Status: AC
Start: 2020-11-26 — End: ?
  Administered 2020-11-27: 11:00:00 2 via RESPIRATORY_TRACT

## 2020-11-26 MED ORDER — FLUTICASONE PROPIONATE 50 MCG/ACTUATION NA SPSN
2 | Freq: Every day | NASAL | 0 refills | Status: AC
Start: 2020-11-26 — End: ?
  Administered 2020-11-27: 15:00:00 2 via NASAL

## 2020-11-26 MED ORDER — HEPARIN (PORCINE) BOLUS FOR CONTINUOUS INF (VIAL)
20-40 [IU]/kg | INTRAVENOUS | 0 refills | Status: AC
Start: 2020-11-26 — End: ?
  Administered 2020-11-27 (×2): 2100 [IU] via INTRAVENOUS

## 2020-11-26 MED ORDER — ROSUVASTATIN 20 MG PO TAB
20 mg | Freq: Every evening | ORAL | 0 refills | Status: AC
Start: 2020-11-26 — End: ?
  Administered 2020-11-27: 06:00:00 20 mg via ORAL

## 2020-11-26 MED ORDER — HEPARIN (PORCINE) IN 5 % DEX 20,000 UNIT/500 ML (40 UNIT/ML) IV SOLP
0-2000 [IU]/h | INTRAVENOUS | 0 refills | Status: AC
Start: 2020-11-26 — End: ?
  Administered 2020-11-27: 06:00:00 1000 [IU]/h via INTRAVENOUS
  Administered 2020-11-28 – 2020-11-29 (×3): 1200 [IU]/h via INTRAVENOUS

## 2020-11-26 MED ORDER — TIOTROPIUM BROMIDE 2.5 MCG/ACTUATION IN MIST
1 | Freq: Every day | RESPIRATORY_TRACT | 0 refills | Status: AC
Start: 2020-11-26 — End: ?
  Administered 2020-11-27: 11:00:00 1 via RESPIRATORY_TRACT

## 2020-11-26 MED ORDER — OMEGA 3-DHA-EPA-FISH OIL 300-1,000 MG PO CPDR
1000 mg | Freq: Every day | ORAL | 0 refills | Status: AC
Start: 2020-11-26 — End: ?
  Administered 2020-11-27 – 2020-11-30 (×4): 1000 mg via ORAL

## 2020-11-26 MED ORDER — NITROGLYCERIN 0.4 MG SL SUBL
.4 mg | SUBLINGUAL | 0 refills | Status: AC | PRN
Start: 2020-11-26 — End: ?

## 2020-11-26 MED ORDER — ASPIRIN 81 MG PO TBEC
81 mg | Freq: Every evening | ORAL | 0 refills | Status: AC
Start: 2020-11-26 — End: ?
  Administered 2020-11-28 – 2020-11-30 (×3): 81 mg via ORAL

## 2020-11-26 NOTE — Progress Notes
69 yr old seen last night for CP. Negative workup.  Did not stay for serial troponins.  First troponin last night 0.010. Dced wit pain 3/10 . Pain worse today left chest pain  and sternal. No SOA less 0.010    Imaging:  CXR- nothing acute   Labs:  INR 1.1, Trop 3.046  Na 138, K 4.0, Cl 106, CO2 23, BUN 11, Cr 0.89, Gluc 168  Meds:  Aspirin Nitro. MS. Plan to start Heparin   Vitals:  EKG  nonspecific intraventricular conduction delay.   131/76  72  16  98.0  97%RA  AOX4   Hx:  CAD. CABG    Reason for Transfer:  Follows with cardiology

## 2020-11-27 ENCOUNTER — Inpatient Hospital Stay: Admit: 2020-11-27 | Payer: Private Health Insurance - Indemnity

## 2020-11-27 ENCOUNTER — Encounter: Admit: 2020-11-27 | Discharge: 2020-11-27 | Payer: Commercial Managed Care - Pharmacy Benefit Manager

## 2020-11-27 ENCOUNTER — Inpatient Hospital Stay: Admit: 2020-11-27 | Discharge: 2020-11-27 | Payer: Commercial Managed Care - Pharmacy Benefit Manager

## 2020-11-27 MED ADMIN — CARVEDILOL 3.125 MG PO TAB [79317]: 3.125 mg | ORAL | @ 07:00:00 | Stop: 2020-11-27 | NDC 00904630061

## 2020-11-27 MED ADMIN — ACETAMINOPHEN 500 MG PO TAB [102]: 1000 mg | ORAL | @ 06:00:00 | NDC 00904673061

## 2020-11-27 MED ADMIN — NITROGLYCERIN IN 5 % DEXTROSE 50 MG/250 ML (200 MCG/ML) IV SOLN [15859]: 0.1 ug/kg/min | INTRAVENOUS | @ 06:00:00 | NDC 00338104902

## 2020-11-27 MED ADMIN — CARVEDILOL 3.125 MG PO TAB [79317]: 3.125 mg | ORAL | @ 15:00:00 | Stop: 2020-11-27 | NDC 00904630061

## 2020-11-27 MED ADMIN — EZETIMIBE 10 MG PO TAB [86887]: 10 mg | ORAL | @ 18:00:00 | NDC 67877049030

## 2020-11-27 MED ADMIN — PERFLUTREN LIPID MICROSPHERES 1.1 MG/ML IV SUSP [79178]: 3 mL | INTRAVENOUS | @ 19:00:00 | Stop: 2020-11-27 | NDC 11994001116

## 2020-11-28 MED ADMIN — ROSUVASTATIN 5 MG PO TAB [88502]: 5 mg | ORAL | @ 03:00:00 | NDC 68462026190

## 2020-11-28 MED ADMIN — CARVEDILOL 6.25 MG PO TAB [77309]: 6.25 mg | ORAL | @ 01:00:00 | NDC 00904630161

## 2020-11-28 MED ADMIN — EZETIMIBE 10 MG PO TAB [86887]: 10 mg | ORAL | @ 11:00:00 | NDC 67877049030

## 2020-11-28 MED ADMIN — CARVEDILOL 6.25 MG PO TAB [77309]: 6.25 mg | ORAL | @ 11:00:00 | NDC 00904630161

## 2020-11-29 ENCOUNTER — Encounter: Admit: 2020-11-29 | Discharge: 2020-11-29 | Payer: Commercial Managed Care - Pharmacy Benefit Manager

## 2020-11-29 MED ADMIN — ASPIRIN 81 MG PO CHEW [680]: 324 mg | ORAL | @ 16:00:00 | Stop: 2020-11-29 | NDC 66553000201

## 2020-11-29 MED ADMIN — CARVEDILOL 6.25 MG PO TAB [77309]: 6.25 mg | ORAL | @ 02:00:00 | NDC 00904630161

## 2020-11-29 MED ADMIN — ROSUVASTATIN 5 MG PO TAB [88502]: 5 mg | ORAL | @ 02:00:00 | NDC 68462026190

## 2020-11-29 MED ADMIN — EZETIMIBE 10 MG PO TAB [86887]: 10 mg | ORAL | @ 14:00:00 | NDC 67877049030

## 2020-11-29 MED ADMIN — CARVEDILOL 6.25 MG PO TAB [77309]: 6.25 mg | ORAL | @ 14:00:00 | NDC 00904630161

## 2020-11-29 MED ADMIN — MAGNESIUM SULFATE IN D5W 1 GRAM/100 ML IV PGBK [166578]: 1 g | INTRAVENOUS | @ 14:00:00 | Stop: 2020-11-29 | NDC 00409672723

## 2020-11-29 NOTE — Telephone Encounter
PA request received from [facility]. Currently inpt for NSTEMI.  Vascepa ordered today by Dr. Jimmye Norman. PA sent thru cover my meds with hospital records. Awaiting insurance decision.

## 2020-11-29 NOTE — Telephone Encounter
Patients insurance did not approve PA. IB sent to [facility].         Mitchell Krause (Key: [id number])    This request has received an Unfavorable outcome.    Please note any additional information provided by [employer] at the bottom of your screen.    You will also receive a faxed copy of the determination.

## 2020-11-30 ENCOUNTER — Encounter: Admit: 2020-11-30 | Discharge: 2020-11-30 | Payer: Private Health Insurance - Indemnity

## 2020-11-30 MED ADMIN — EZETIMIBE 10 MG PO TAB [86887]: 10 mg | ORAL | @ 13:00:00 | Stop: 2020-11-30 | NDC 67877049030

## 2020-11-30 MED ADMIN — CARVEDILOL 6.25 MG PO TAB [77309]: 6.25 mg | ORAL | @ 13:00:00 | Stop: 2020-11-30 | NDC 00904630161

## 2020-11-30 MED ADMIN — ROSUVASTATIN 5 MG PO TAB [88502]: 5 mg | ORAL | @ 02:00:00 | NDC 68462026190

## 2020-11-30 MED ADMIN — CARVEDILOL 6.25 MG PO TAB [77309]: 6.25 mg | ORAL | @ 02:00:00 | NDC 00904630161

## 2020-11-30 MED ADMIN — CLOPIDOGREL 300 MG PO TAB [166651]: 300 mg | ORAL | @ 15:00:00 | Stop: 2020-11-30 | NDC 00904646707

## 2020-11-30 MED FILL — NITROGLYCERIN 0.4 MG SL SUBL: 0.4 mg | SUBLINGUAL | 15 days supply | Qty: 25 | Fill #1 | Status: CP

## 2020-11-30 MED FILL — CARVEDILOL 6.25 MG PO TAB: 6.25 mg | ORAL | 30 days supply | Qty: 60 | Fill #1 | Status: CP

## 2020-11-30 MED FILL — EZETIMIBE 10 MG PO TAB: 10 mg | ORAL | 30 days supply | Qty: 30 | Fill #1 | Status: CP

## 2020-11-30 MED FILL — SYMBICORT 160-4.5 MCG/ACTUATION IN HFAA: RESPIRATORY_TRACT | 30 days supply | Qty: 10.2 | Fill #1 | Status: CP

## 2020-11-30 MED FILL — CLOPIDOGREL 75 MG PO TAB: 75 mg | ORAL | 30 days supply | Qty: 30 | Fill #1 | Status: CP

## 2020-12-15 ENCOUNTER — Encounter: Admit: 2020-12-15 | Discharge: 2020-12-15 | Payer: Private Health Insurance - Indemnity

## 2020-12-15 ENCOUNTER — Ambulatory Visit: Admit: 2020-12-15 | Discharge: 2020-12-16 | Payer: Private Health Insurance - Indemnity

## 2020-12-15 DIAGNOSIS — E785 Hyperlipidemia, unspecified: Secondary | ICD-10-CM

## 2020-12-15 DIAGNOSIS — Z789 Other specified health status: Secondary | ICD-10-CM

## 2020-12-15 DIAGNOSIS — E7800 Pure hypercholesterolemia, unspecified: Secondary | ICD-10-CM

## 2020-12-15 DIAGNOSIS — Z95828 Presence of other vascular implants and grafts: Secondary | ICD-10-CM

## 2020-12-15 DIAGNOSIS — R251 Tremor, unspecified: Secondary | ICD-10-CM

## 2020-12-15 DIAGNOSIS — F17210 Nicotine dependence, cigarettes, uncomplicated: Secondary | ICD-10-CM

## 2020-12-15 DIAGNOSIS — Z951 Presence of aortocoronary bypass graft: Secondary | ICD-10-CM

## 2020-12-15 DIAGNOSIS — I214 Non-ST elevation (NSTEMI) myocardial infarction: Secondary | ICD-10-CM

## 2020-12-15 DIAGNOSIS — I739 Peripheral vascular disease, unspecified: Secondary | ICD-10-CM

## 2020-12-15 DIAGNOSIS — I2510 Atherosclerotic heart disease of native coronary artery without angina pectoris: Secondary | ICD-10-CM

## 2020-12-15 DIAGNOSIS — R0600 Dyspnea, unspecified: Secondary | ICD-10-CM

## 2020-12-15 NOTE — Progress Notes
Telehealth Visit Note    Date of Service: 12/15/2020    Subjective:      Obtained patient's verbal consent to treat them and their agreement to [facility] financial policy and NPP via this telehealth visit during the [facility]       Mitchell Krause is a 70 y.o. male.    History of Present Illness  I had the pleasure of doing a telehealth visit with Mitchell Krause today. He has CAD with prior inferior MI, angioplasty of RCA in 1995 and s/p CABG in 2000, HLD, PAD, and tobacco use who was transferred from [facility] to [place] and admitted 4/29-11/30/2020 with an NSTEMI.    His echocardiogram 4/30 showed LVEF 55-65%, mild LVH, grade I diastolic dysfunction, and aortic sclerosis but no other significant valve disease. Cardiac catheterization on 11/29/20 via right femoral artery access revealed an occluded native LAD and RCA, patent LIMA-LAD, occluded vein graft to the ramus but the native vessel had no significant stenosis.The SVG to the OM branches was occluded at the ostium. The proximal circumflex and the OM1 both had 60-70% stenosis and were assessed by RFR which was not hemodynamically significant. The radial graft to right posterolateral branch of the RCA was patent. No PCI was performed due to patent LIMA and radial grafts and adequate flow in the native ramus intermedius and circumflex arteries. He was loaded with plavix and is to continue DAPT with Aspirin and Plavix for a year and then Aspirin indefinitely. He was also started on Carvedilol to help with blood pressure control.     Today, he reports he has not had any further chest discomfort.  He is back to doing his usual activities, including playing golf.  He reports he has been remodeling his kitchen.  He has not noted shortness of breath, lightheadedness, leg edema or bleeding tendencies.  He reports his right groin access site has healed well. He has been noting rather significant headaches after taking his morning medications, since he was discharged.  He reports the headaches last for a few hours.  It does not recur night after taking his second dose of carvedilol.  He he had historically, his blood pressure runs around 130/70 discharge, it was quite a bit lower.     Finally, he has a history of peripheral arterial disease with prior left popliteal endarterectomy, left anterior tibial artery endarterectomy, and left proximal popliteal to tibial peroneal trunk bypass surgery by Mitchell Krause in May 2018.  More recently, he has been having right calf pain. When he saw Mitchell Krause in February, she recommended he have ABIs and a lower extremity arterial study.  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 He was subsequently seen by a vascular PA at [facility] in [place].  It was felt that since he has collaterals and his right calf symptoms are not worsening, that this could be monitored.  He reports he can walk up to 3/4 of a mile and starts getting claudication a little before this, but if he pauses and rests at the 3/4 mile mark, his calf pain resolves and may not return when he walks another 3/4 mile back to his house.       Review of Systems   All other systems reviewed and are negative.        Objective:         ? aspirin EC 81 mg tablet Take 81  mg by mouth at bedtime daily. Take with food.    ? carvediloL (COREG) 6.25 mg tablet Take one tablet by mouth twice daily. Take with food.   ? clopiDOGrel (PLAVIX) 75 mg tablet Take one tablet by mouth daily.   ? coenzyme Q10 100 mg cap Take 100 mg by mouth daily.   ? evolocumab (REPATHA SYRINGE) 140 mg/mL injectable SYRINGE Inject 1 mL under the skin every 14 days.   ? ezetimibe (ZETIA) 10 mg tablet Take one tablet by mouth daily.   ? fluticasone propionate (FLONASE) 50 mcg/actuation nasal spray, suspension Apply 2 sprays to each nostril as directed daily.   ? HYDROcodone/acetaminophen (NORCO) 7.5/325 mg tablet Take 1 tablet by mouth as Needed   ? nitroglycerin (NITROSTAT) 0.4 mg tablet Place one tablet under tongue every 5 minutes as needed for Chest Pain. Max of 3 tablets, call 911.   ? omega-3 fatty acids-vitamin E 1,000 mg cap Take 1 capsule by mouth at bedtime daily.   ? potassium gluconate 600 mg (99 mg) tab Take 1 tablet by mouth as Needed.   ? rosuvastatin (CRESTOR) 5 mg tablet Take one tablet by mouth daily.   ? SPIRIVA RESPIMAT 2.5 mcg/actuation inhaler Inhale 1 puff by mouth into the lungs daily.   ? SYMBICORT 160-4.5 mcg/actuation aerosol inhaler Inhale two puffs by mouth into the lungs twice daily.          Telehealth Patient Reported Vitals     Row Name 12/15/20 0830                Weight: 104.3 kg (230 lb)        Height: 180.3 cm (5' 11)        Pain Score: Zero                  Telehealth Body Mass Index: 32.08 at 12/15/2020 10:10 AM    Physical Exam-limited-telehealth visit  Gen: appears stated age, in no acute distress  Head: normocephalic, atraumatic  Eyes: sclera non-icteric, EOMs intact   Mouth: mucous membranes are moderately moist  Lungs: breathing is not labored  Neurological: A&Ox3, no focal deficits noted  Psychiatric: calm, pleasant, and cooperative         Assessment and Plan:  Coronary Artery Disease  He has not had further episodes of chest discomfort, shortness of breath since his hospitalization.  He had started having headaches, which may be related to the new medications which include Plavix (clopidogrel), Zetia (ezetimibe), and carvedilol.  I am less suspicious of carvedilol since he does not get a headache later in the day after taking the second dose.  I recommended he try stopping the Zetia and also, that he buy a blood pressure cuff.  One of our nurses will call him in 10 days and see if his headaches are better off the Zetia and to review his BP readings. He has a follow up with Mitchell Krause in July.    Hyperlipidemia  His lipid panel 11/27/20 revealed Chol 116, LDL 69, TRG 152, HDL 36 and he was started on Zetia during his recent hospitalization.  He reports he was compliant with taking Rosuvastatin 5mg  daily and Repatha at that time. He has had some issues with insurance coverage for the Repatha but that seems to be addressed at the present time.  He has also had issues with multiple statins in the past with side effects of myalgias, joint pain and memory loss but seems to be tolerating the current  dose of Rosuvastatin. As noted, I told him to hold the Zetia to see if it is causing his headaches. He is willing to increase the Rosuvastatin dose a little if needed. Of note, his HgbA1C was 5.8 last month which technically means he has pre-diabetes, but he reports he has significantly reduced the sugar in his diet and has lost 30 pounds in the past few months, so I suspect his HgbA1C will be better in a few months and should just be rechecked.     Hypertension  I recommended he buy a blood pressure cuff and start checking his home BPs. We will follow up with him late next week about his readings. I would like to keep his BPs consistently <130/80.    Peripheral Arterial Disease  He has had prior left popliteal and anterior tibial artery endarterectomy and left proximal popliteal to tibial peroneal trunk bypass surgery by Mitchell Krause in May 2018. He reports he had an arterial study ~1 month ago in [place] and saw the vascular team at [facility] who recommended continued walking and monitoring. I instructed him to let us or the team at [facility] know if he develops rest pain or foot ulcers that are not healing, both of which he denies currently. Continue risk factor modification.                             Total Time Today was 25 minutes in the following activities: Preparing to see the patient, Performing a medically appropriate examination and/or evaluation, Counseling and educating the patient/family/caregiver, Ordering medications, tests, or procedures, Documenting clinical information in the electronic or other health record and Care coordination (not separately reported)

## 2020-12-24 ENCOUNTER — Encounter: Admit: 2020-12-24 | Discharge: 2020-12-24 | Payer: Private Health Insurance - Indemnity

## 2020-12-24 NOTE — Telephone Encounter
Spoke with patient to see how he's doing. He states headaches have gone away since stopping the zetia. He has not yet purchased a blood pressure cuff, so we don't have any readings. Advised patient to purchase one and we'll check back in a week or two.

## 2020-12-24 NOTE — Telephone Encounter
Strout, Vida Rigger, RN  Caller: Unspecified (Today, 8:51 AM)  Yes, stop Zetia.     See if he is still willing to increase the Rosuvastatin to 10mg  daily and repeat a lipid panel in a couple of months. If he develops muscle/joint pain with the increased dose, he can go back to 5mg  daily.

## 2020-12-28 ENCOUNTER — Encounter: Admit: 2020-12-28 | Discharge: 2020-12-28 | Payer: Private Health Insurance - Indemnity

## 2020-12-28 MED ORDER — ROSUVASTATIN 5 MG PO TAB
5 mg | ORAL_TABLET | Freq: Every day | ORAL | 3 refills | 90.00000 days | Status: AC
Start: 2020-12-28 — End: ?

## 2020-12-28 MED ORDER — CLOPIDOGREL 75 MG PO TAB
75 mg | ORAL_TABLET | Freq: Every day | ORAL | 3 refills | 90.00000 days | Status: AC
Start: 2020-12-28 — End: ?

## 2020-12-28 MED ORDER — EZETIMIBE 10 MG PO TAB
10 mg | ORAL_TABLET | Freq: Every day | ORAL | 3 refills | Status: AC
Start: 2020-12-28 — End: ?

## 2020-12-28 MED ORDER — REPATHA SYRINGE 140 MG/ML SC SYRG
140 mg | SUBCUTANEOUS | 3 refills | 28.00000 days | Status: AC
Start: 2020-12-28 — End: ?

## 2020-12-28 MED ORDER — CARVEDILOL 6.25 MG PO TAB
6.25 mg | ORAL_TABLET | Freq: Two times a day (BID) | ORAL | 1 refills | 90.00000 days | Status: AC
Start: 2020-12-28 — End: ?

## 2021-01-14 ENCOUNTER — Encounter: Admit: 2021-01-14 | Discharge: 2021-01-14 | Payer: Private Health Insurance - Indemnity

## 2021-01-14 MED ORDER — CARVEDILOL 6.25 MG PO TAB
6.25 mg | ORAL_TABLET | Freq: Two times a day (BID) | ORAL | 3 refills | 90.00000 days | Status: AC
Start: 2021-01-14 — End: ?

## 2021-01-14 MED ORDER — ROSUVASTATIN 10 MG PO TAB
10 mg | ORAL_TABLET | Freq: Every day | ORAL | 3 refills | 90.00000 days | Status: AC
Start: 2021-01-14 — End: ?

## 2021-01-14 MED ORDER — CLOPIDOGREL 75 MG PO TAB
75 mg | ORAL_TABLET | Freq: Every day | ORAL | 3 refills | 90.00000 days | Status: AC
Start: 2021-01-14 — End: ?

## 2021-02-17 ENCOUNTER — Encounter: Admit: 2021-02-17 | Discharge: 2021-02-17 | Payer: Private Health Insurance - Indemnity

## 2021-02-17 DIAGNOSIS — Z951 Presence of aortocoronary bypass graft: Secondary | ICD-10-CM

## 2021-02-17 DIAGNOSIS — E7800 Pure hypercholesterolemia, unspecified: Secondary | ICD-10-CM

## 2021-02-17 DIAGNOSIS — F17210 Nicotine dependence, cigarettes, uncomplicated: Secondary | ICD-10-CM

## 2021-02-17 DIAGNOSIS — E785 Hyperlipidemia, unspecified: Secondary | ICD-10-CM

## 2021-02-17 DIAGNOSIS — I2510 Atherosclerotic heart disease of native coronary artery without angina pectoris: Secondary | ICD-10-CM

## 2021-02-17 DIAGNOSIS — Z8249 Family history of ischemic heart disease and other diseases of the circulatory system: Secondary | ICD-10-CM

## 2021-02-17 DIAGNOSIS — R0600 Dyspnea, unspecified: Secondary | ICD-10-CM

## 2021-02-17 DIAGNOSIS — R251 Tremor, unspecified: Secondary | ICD-10-CM

## 2021-02-17 DIAGNOSIS — I214 Non-ST elevation (NSTEMI) myocardial infarction: Secondary | ICD-10-CM

## 2021-02-17 DIAGNOSIS — I739 Peripheral vascular disease, unspecified: Secondary | ICD-10-CM

## 2021-02-17 DIAGNOSIS — Z95828 Presence of other vascular implants and grafts: Secondary | ICD-10-CM

## 2021-02-17 MED ORDER — REPATHA SURECLICK 140 MG/ML SC PNIJ
140 mg | SUBCUTANEOUS | 3 refills | 28.00000 days | Status: AC
Start: 2021-02-17 — End: ?

## 2021-02-17 NOTE — Progress Notes
Date of Service: 02/17/2021    Mitchell Krause is a 70 y.o. male.       HPI      Mitchell Krause  is a 70 year old white male with a known history of coronary artery disease, this dates back in October 1995 when patient suffered an inferior wall myocardial infarction, at that time he did fail tPA and underwent an LHC that revealed a 99% occluded RCA, he underwent angioplasty of this artery, status post surgical revascularization in April 2000 (LIMA to LAD, SVG to intermediate branch, sequential SVG to 2 obtuse marginal branches, free radial to RCA), hypertension, hyperlipidemia and historical intolerance to statin therapy, history of tobacco use (patient did quit use of tobacco products in May 2022, no history of PAD and status post previous left lower extremity surgical revascularization, currently with no symptoms of claudication.    At the beginning of May 2022, patient did experience chest pain, he presented to local hospital in [place], he was transferred to [facility] with elevated troponins.    He did undergo an LHC on 11/29/2020, this revealed patent LIMA to LAD, patent SVG to RPL V graft, occluded SVG to ramus and occluded SVG to obtuse marginal branches, patent native left circumflex and ramus intermedius with hemodynamically insignificant RFR across this vessels, occluded native RCA, patent radial graft to RPLV.    In terms of PAD, patient did undergo tibial peroneal bypass in 2018.  Patient was evaluated with lower extremity arterial duplex on 09/09/2020, this demonstrated patent left femoral bypass graft, no flow was noted in the left popliteal and peroneal arteries, the left femoral, superficial femoral and posterior tibial and anterior tibial arteries did not demonstrate any significant stenosis.  An occlusion is present in the right superficial femoral artery with a large arterial collateral extending to the right posterior tibial artery, without any flow detected in the left popliteal or peroneal arteries. Medical treatment was recommended.    At present time patient is doing well from a cardiac standpoint.  He does not report symptoms of angina and has not used sublingual nitroglycerin.  He also has not experienced any symptoms of claudication.    Following this last hospitalization patient has been able to quit use of tobacco products.       Vitals:    02/17/21 1417   O2 Device: None (Room air)   PainSc: Zero     There is no height or weight on file to calculate BMI.     Past Medical History  Patient Active Problem List    Diagnosis Date Noted   ? NSTEMI (non-ST elevated myocardial infarction) (HCC) 11/26/2020   ? History of tobacco abuse 09/02/2020   ? L Pop to tibial prineal bypass w/ endarterectomies 2018 06/24/2019     May 2018 LEFT POPLITEAL ENDARTERECTOMY, LEFT ANTERIOR TIBIAL ENDARTERECTOMY, LEFT PROXIMAL POPLITEAL TO TIBIAL PERONEAL TRUNK BYPASS Dr. Gladstone Lighter [facility]      ? 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  Specifics of reactions are listed in the allergy section.  L.     ? PAD (peripheral artery disease) (HCC) 11/10/2016   ? Coronary atherosclerosis due to calcified coronary lesion of native artery 02/23/2015     A. 10/95-Inferior MI, Failed t-PA;Cath:99% RCA dilated, mod disease elsewhere  B. 3/00-Exercise echo:nondiagnostic due to low level of stress achieved  C. 3/00- Cath: 60% [place], severe RCA, OM, Dx, Mod  LAD p + thall w/ post ischemia   D. 11/04/98-CABGx5:LIMA-LAD, SVG-Intermed, SeqSVG-OM-OM, [facility] Cord [facility] [place]  i. 6/07 [facility] My[facility]iew [facility]: EF 50% no ischemia/infarct  >09/06/10  9 min Bruce Exercise thall: EF 54%, Normal perfusion, 110% HR    11/29/20: NSTEMI/Cath - Patent LIMA-LAD, patent SVG-RPLV graft. Occluded SVG-ramus and SVG-OM grafts, but with patent native left circumflex artery and native ramus intermedius, with hemodynamically insignificant RFR across these vessels.  Occluded native RCA, but with patent radial graft to the RPLV.       ? Family history of heart disease in male family member before age 59 09/01/2010   ? Hypercholesteremia 09/01/2010     Patient had memory loss with statins in the past and does not wish to increase Crestor dose.  01/2015  LDL 196 dropped to the 80s and 90s on statin 2016-2018   10/19 Off Crestor d.t severe myalgias, Could only tolerate 10 days q 2 weeks.      ? 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            Review of Systems   Constitutional: Negative.   HENT: Negative.    Eyes: Negative.    Cardiovascular: Negative.    Respiratory: Negative.    Endocrine: Negative.    Hematologic/Lymphatic: Negative.    Skin: Negative.    Musculoskeletal: Negative.    Gastrointestinal: Negative.    Genitourinary: Negative.    Neurological: Negative.    Psychiatric/Behavioral: Negative.    Allergic/Immunologic: Negative.        Physical Exam    General Appearance: normal in appearance  Skin: warm, moist, no ulcers or xanthomas  Eyes: conjunctivae and lids normal, pupils are equal and round  Lips & Oral Mucosa: no pallor or cyanosis  Neck Veins: neck veins are flat, neck veins are not distended  Chest Inspection: chest is normal in appearance  Respiratory Effort: breathing comfortably, no respiratory distress  Auscultation/Percussion: lungs clear to auscultation, no rales or rhonchi, no wheezing  Cardiac Rhythm: regular rhythm and normal rate  Cardiac Auscultation: S1, S2 normal, no rub, no gallop  Murmurs: no murmur  Carotid Arteries: normal carotid upstroke bilaterally, no bruit  Lower Extremity Edema: no lower extremity edema, both lower extremities are warm, hair is present, decreased distal pulses  Abdominal Exam: soft, non-tender, no masses, bowel sounds normal  Liver & Spleen: no organomegaly  Language and Memory: patient responsive and seems to comprehend information  Neurologic Exam: neurological assessment grossly intact    Cardiovascular Studies    Twelve-lead EKG demonstrates sinus rhythm, ventricular rate 67 bpm, no axis deviation, no ST segment T wave changes.  Cardiovascular Health Factors  Vitals BP Readings from Last 3 Encounters:   11/30/20 99/74   09/02/20 132/65   06/24/19 (!) 146/82     Wt Readings from Last 3 Encounters:   11/30/20 103.1 kg (227 lb 4.7 oz)   09/02/20 108.9 kg (240 lb)   06/24/19 107.3 kg (236 lb 9.6 oz)     BMI Readings from Last 3 Encounters:   11/30/20 31.70 kg/m?   09/02/20 32.55 kg/m?   06/24/19 32.09 kg/m?      Smoking Social History     Tobacco Use   Smoking Status Current Every Day Smoker   ? Packs/day: 0.50   ? Years: 43.00   ? Pack years: 21.50   ? Types:  Cigarettes   Smokeless Tobacco Never Used   Tobacco Comment    smoking cessation counseling given 09/01/2010      Lipid Profile Cholesterol   Date Value Ref Range Status   11/27/2020 116 <200 MG/DL Final     HDL   Date Value Ref Range Status   11/27/2020 36 (L) >40 MG/DL Final     LDL   Date Value Ref Range Status   11/27/2020 69 <100 mg/dL Final     Triglycerides   Date Value Ref Range Status   11/27/2020 152 (H) <150 MG/DL Final      Blood Sugar Hemoglobin A1C   Date Value Ref Range Status   11/26/2020 5.8 4.0 - 6.0 % Final     Comment:     The ADA recommends that most patients with type 1 and type 2 diabetes maintain   an A1c level <7%.       Glucose   Date Value Ref Range Status   11/30/2020 92 70 - 100 MG/DL Final   16/05/9603 97 70 - 100 MG/DL Final   54/03/8118 147 (H) 70 - 100 MG/DL Final          Problems Addressed Today  Encounter Diagnoses   Name Primary?   ? Hypercholesteremia Yes   ? Coronary atherosclerosis due to calcified coronary lesion of native artery    ? PAD (peripheral artery disease) (HCC)    ? L Pop to tibial prineal bypass w/ endarterectomies 2018    ? NSTEMI (non-ST elevated myocardial infarction) (HCC)    ? Family history of heart disease in male family member before age 37    ? Cigarette smoker one half pack a day or less        Assessment and Plan     Assessment:    1.  Coronary artery disease- no current symptoms of angina, no nitroglycerin use  ? Patient does have a history of inferior wall MI in October 1995, he failed tPA, he then underwent PCI of the RCA  ? Status post CABG in April 2000,LIMA to LAD, SVG to intermediate branch, sequential SVG to 2 obtuse marginal branches and the free radial graft to RCA  ? Non-ST MI in May 2022  ? Status post left heart catheterization on 11/29/2020 -- patent LIMA to LAD, patent SVG to RPL V graft, occluded SVG to ramus and occluded SVG to obtuse marginal branches, patent native left circumflex and ramus intermedius with hemodynamically insignificant RFR across this vessels, occluded native RCA, patent radial graft to RPLV.  ? Patient was initiated on clopidogrel and present time continues on dual antiplatelet therapy  2.  PAD -  no current symptoms of claudication  ? Status post left lower extremity surgical revascularization in 2018, patient did undergo left popliteal endarterectomy, left anterior tibial atherectomy, left proximal popliteal to tibioperoneal trunk bypass  ? Status post lower arterial extremity duplex in February 2022- patient was found to have patent flow in the left lower extremity, severe stenosis of the distal superficial right femoral artery with collateral circulation  3.  Hyperlipidemia-patient is on statin therapy and evolocumab, he has been able to tolerate this regimen well  4.  History of tobacco use-patient quit use of tobacco products in May 2022  5.  Primary hypertension-under good control    Plan:    1.  Continue all current medications  2.  Continue risk factors modifications  3.  Follow-up office visit in March - April 2023  Total Time Today was 40 minutes in the following activities: Preparing to see the patient, Obtaining and/or reviewing separately obtained history, Performing a medically appropriate examination and/or evaluation, Counseling and educating the patient/family/caregiver, Ordering medications, tests, or procedures, Referring and communication with other health care professionals (when not separately reported), Documenting clinical information in the electronic or other health record, Independently interpreting results (not separately reported) and communicating results to the patient/family/caregiver and Care coordination (not separately reported)           Current Medications (including today's revisions)  ? aspirin EC 81 mg tablet Take 81 mg by mouth at bedtime daily. Take with food.    ? carvediloL (COREG) 6.25 mg tablet Take one tablet by mouth twice daily. Take with food.   ? clopiDOGrel (PLAVIX) 75 mg tablet Take one tablet by mouth daily.   ? coenzyme Q10 100 mg cap Take 100 mg by mouth daily.   ? evolocumab (REPATHA SYRINGE) 140 mg/mL injectable SYRINGE Inject 1 mL under the skin every 14 days.   ? fluticasone propionate (FLONASE) 50 mcg/actuation nasal spray, suspension Apply 2 sprays to each nostril as directed daily.   ? HYDROcodone/acetaminophen (NORCO) 7.5/325 mg tablet Take 1 tablet by mouth as Needed   ? nitroglycerin (NITROSTAT) 0.4 mg tablet Place one tablet under tongue every 5 minutes as needed for Chest Pain. Max of 3 tablets, call 911.   ? omega-3 fatty acids-vitamin E 1,000 mg cap Take 1 capsule by mouth at bedtime daily.   ? potassium gluconate 600 mg (99 mg) tab Take 1 tablet by mouth as Needed.   ? rosuvastatin (CRESTOR) 10 mg tablet Take one tablet by mouth daily.   ? SPIRIVA RESPIMAT 2.5 mcg/actuation inhaler Inhale 1 puff by mouth into the lungs daily.   ? SYMBICORT 160-4.5 mcg/actuation aerosol inhaler Inhale two puffs by mouth into the lungs twice daily.

## 2021-02-17 NOTE — Patient Instructions
Refilled Repatha     Follow-up in March    Follow up as directed.  Call sooner if issues.  Call the [facility] nursing line at [phone].  Leave a detailed message for the nurse in [place] with how we can assist you and we will call you back.

## 2021-02-18 ENCOUNTER — Encounter: Admit: 2021-02-18 | Discharge: 2021-02-18 | Payer: Private Health Insurance - Indemnity

## 2021-02-21 ENCOUNTER — Encounter: Admit: 2021-02-21 | Discharge: 2021-02-21 | Payer: Private Health Insurance - Indemnity

## 2021-02-21 NOTE — Progress Notes
The Prior Authorization for Repatha has been submitted for Daun Peacock via Cover My Meds.  Will continue to follow.    [employer]  Pharmacy Patient Advocate  [phone]

## 2021-02-23 ENCOUNTER — Encounter: Admit: 2021-02-23 | Discharge: 2021-02-23 | Payer: Private Health Insurance - Indemnity

## 2021-02-23 NOTE — Progress Notes
The Prior Authorization RENEWAL for Repatha was approved for L[place]INGTON KROTZ until 02/21/2022. The PA authorization number is [id number].      [employer]  Pharmacy Patient Advocate  [phone]

## 2021-03-09 ENCOUNTER — Encounter: Admit: 2021-03-09 | Discharge: 2021-03-09 | Payer: Private Health Insurance - Indemnity

## 2021-03-09 MED ORDER — REPATHA SURECLICK 140 MG/ML SC PNIJ
140 mg | SUBCUTANEOUS | 3 refills | 28.00000 days | Status: AC
Start: 2021-03-09 — End: ?

## 2021-07-13 ENCOUNTER — Encounter: Admit: 2021-07-13 | Discharge: 2021-07-13 | Payer: Private Health Insurance - Indemnity

## 2021-07-13 MED ORDER — REPATHA SURECLICK 140 MG/ML SC PNIJ
140 mg | SUBCUTANEOUS | 11 refills | 28.00000 days | Status: AC
Start: 2021-07-13 — End: ?

## 2021-09-14 ENCOUNTER — Encounter: Admit: 2021-09-14 | Discharge: 2021-09-14 | Payer: Private Health Insurance - Indemnity

## 2021-10-06 ENCOUNTER — Encounter: Admit: 2021-10-06 | Discharge: 2021-10-06 | Payer: Private Health Insurance - Indemnity

## 2021-10-06 NOTE — Progress Notes
Records Request- STAT    Medical records request for continuation of care:    Patient has appointment with  Dr. Avie Arenas    Please fax records to Cardiovascular Medicine [place] [facility] [phone]    Request records:    Mitchell Krause  [birth year]    Office Notes (Dr. Christena Deem most recent)        Thank you,      Cardiovascular Medicine  [facility]  [address]  [place], [place] [address]  Phone:  [phone]  Fax:  [phone]

## 2021-10-11 ENCOUNTER — Ambulatory Visit: Admit: 2021-10-11 | Discharge: 2021-10-12 | Payer: Private Health Insurance - Indemnity

## 2021-10-11 ENCOUNTER — Encounter: Admit: 2021-10-11 | Discharge: 2021-10-11 | Payer: Private Health Insurance - Indemnity

## 2021-10-11 DIAGNOSIS — E785 Hyperlipidemia, unspecified: Secondary | ICD-10-CM

## 2021-10-11 DIAGNOSIS — Z951 Presence of aortocoronary bypass graft: Secondary | ICD-10-CM

## 2021-10-11 DIAGNOSIS — I2510 Atherosclerotic heart disease of native coronary artery without angina pectoris: Secondary | ICD-10-CM

## 2021-10-11 DIAGNOSIS — F17210 Nicotine dependence, cigarettes, uncomplicated: Secondary | ICD-10-CM

## 2021-10-11 DIAGNOSIS — E7800 Pure hypercholesterolemia, unspecified: Secondary | ICD-10-CM

## 2021-10-11 DIAGNOSIS — Z95828 Presence of other vascular implants and grafts: Secondary | ICD-10-CM

## 2021-10-11 DIAGNOSIS — R251 Tremor, unspecified: Secondary | ICD-10-CM

## 2021-10-11 DIAGNOSIS — Z789 Other specified health status: Secondary | ICD-10-CM

## 2021-10-11 DIAGNOSIS — Z8249 Family history of ischemic heart disease and other diseases of the circulatory system: Secondary | ICD-10-CM

## 2021-10-11 DIAGNOSIS — R0600 Dyspnea, unspecified: Secondary | ICD-10-CM

## 2021-10-11 DIAGNOSIS — I214 Non-ST elevation (NSTEMI) myocardial infarction: Secondary | ICD-10-CM

## 2021-10-11 DIAGNOSIS — I739 Peripheral vascular disease, unspecified: Secondary | ICD-10-CM

## 2021-10-11 NOTE — Progress Notes
Date of Service: 10/11/2021    Mitchell Krause is a 71 y.o. male.       HPI     Mitchell Krause is a 71 y.o. 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    Patient does not report having symptoms of angina, he did not take any sublingual nitroglycerin.    He does have a history of chest pain in May 2022, at that time he was evaluated with an LHC at [facility], this revealed patent LIMA to LAD, patent SVG to RPL V graft, occluded SVG to ramus intermedius, occluded SVG to OM branches, patent native left circumflex and RI, it was no hemodynamically significant RFR across these vessels, occluded native RCA, but patent radial graft to this vessel.    In regards to his known history of PAD patient currently does not have any symptoms of claudication.  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    Patient did gain approximately 17 pounds since April 2022 due to dietary indiscretion.          Vitals:    10/11/21 1052   BP: (!) 148/94   BP Source: Arm, Left Upper   Pulse: 85   SpO2: 98%   O2 Device: None (Room air)   PainSc: Zero   Weight: 110.7 kg (244 lb)   Height: 180.3 cm (5' 11)     Body mass index is 34.03 kg/m?Marland Kitchen     Past Medical History  Patient Active Problem List    Diagnosis Date Noted   ? NSTEMI (non-ST elevated myocardial infarction) (HCC) 11/26/2020 ? History of tobacco abuse 09/02/2020   ? L Pop to tibial prineal bypass w/ endarterectomies 2018 06/24/2019     May 2018 LEFT POPLITEAL ENDARTERECTOMY, LEFT ANTERIOR TIBIAL ENDARTERECTOMY, LEFT PROXIMAL POPLITEAL TO TIBIAL PERONEAL TRUNK BYPASS Dr. Gladstone L[facility]      ? 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  Specifics of reactions are listed in the allergy section.  L.     ? PAD (peripheral artery disease) (HCC) 11/10/2016   ? Coronary atherosclerosis due to calcified coronary lesion of native artery 02/23/2015     A. 10/95-Inferior MI, Failed t-PA;Cath:99% RCA dilated, mod disease elsewhere  B. 3/00-Exercise echo:nondiagnostic due to low level of stress achieved  C. 3/00- Cath: 60% Lmain, severe RCA, OM, Dx, Mod LAD p + thall w/ post ischemia   D. 11/04/98-CABGx5:LIMA-LAD, SVG-Intermed, SeqSVG-OM-OM, [facility] [place]  i. 6/07 [facility] My[facility]iew [facility]: EF 50% no ischemia/infarct  >09/06/10  9 min Bruce Exercise thall: EF 54%, Normal perfusion, 110% HR    11/29/20: NSTEMI/Cath - Patent LIMA-LAD, patent SVG-RPLV graft. Occluded SVG-ramus and SVG-OM grafts, but with patent native left circumflex artery and native ramus intermedius, with hemodynamically insignificant RFR across these vessels.  Occluded native RCA, but with patent radial graft to the RPLV.       ? Family history of heart disease in male family member before age 14 09/01/2010   ? Hypercholesteremia 09/01/2010     Patient had memory loss with statins in the past and does not wish to increase Crestor dose.  01/2015  LDL 196 dropped to the 80s and 90s on statin 2016-2018   10/19 Off Crestor d.t severe myalgias, Could only tolerate 10 days q 2 weeks.      ? 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            Review of Systems   Constitutional: Negative.   HENT: Negative.    Eyes: Negative.    Cardiovascular: Negative.    Respiratory: Negative.    Endocrine: Negative.    Hematologic/Lymphatic: Negative.    Skin: Negative.    Musculoskeletal: Negative.    Gastrointestinal: Negative.    Genitourinary: Negative.    Neurological: Negative.    Psychiatric/Behavioral: Negative.    Allergic/Immunologic: Negative.        Physical Exam  General Appearance: obese  Skin: warm, moist, no ulcers or xanthomas  Eyes: conjunctivae and lids normal, pupils are equal and round  Lips & Oral Mucosa: no pallor or cyanosis  Neck Veins: neck veins are flat, neck veins are not distended  Chest Inspection: chest is normal in appearance  Respiratory Effort: breathing comfortably, no respiratory distress  Auscultation/Percussion: lungs clear to auscultation, no rales or rhonchi, no wheezing  Cardiac Rhythm: regular rhythm and normal rate  Cardiac Auscultation: S1, S2 normal, no rub, no gallop  Murmurs: no murmur  Carotid Arteries: normal carotid upstroke bilaterally, no bruit  Abdominal aorta: could not be examined due to obese adomen  Lower Extremity Edema: no lower extremity edema, decreased distal pulses  Abdominal Exam: soft, non-tender, no masses, bowel sounds normal  Liver & Spleen: no organomegaly  Language and Memory: patient responsive and seems to comprehend information  Neurologic Exam: neurological assessment grossly intact        Cardiovascular Studies      Cardiovascular Health Factors  Vitals BP Readings from Last 3 Encounters:   10/11/21 (!) 148/94   02/17/21 136/74   11/30/20 99/74     Wt Readings from Last 3 Encounters:   10/11/21 110.7 kg (244 lb)   02/17/21 107.7 kg (237 lb 6.4 oz)   11/30/20 103.1 kg (227 lb 4.7 oz)     BMI Readings from Last 3 Encounters:   10/11/21 34.03 kg/m?   02/17/21 32.20 kg/m?   11/30/20 31.70 kg/m?      Smoking Social History     Tobacco Use   Smoking Status Former   ? Packs/day: 0.50   ? Years: 43.00   ? Pack years: 21.50   ? Types: Cigarettes   ? Quit date: 12/21/2020   ? Years since quitting: 0.8   Smokeless Tobacco Never   Tobacco Comments    smoking cessation counseling given 09/01/2010      Lipid Profile Cholesterol   Date Value Ref Range Status   05/02/2021 104  Final     HDL   Date Value Ref Range Status   05/02/2021 58  Final     LDL   Date Value Ref Range Status   05/02/2021 31  Final     Triglycerides  Date Value Ref Range Status   05/02/2021 77  Final      Blood Sugar Hemoglobin A1C   Date Value Ref Range Status   11/26/2020 5.8 4.0 - 6.0 % Final     Comment:     The ADA recommends that most patients with type 1 and type 2 diabetes maintain   an A1c level <7%.       Glucose   Date Value Ref Range Status   11/30/2020 92 70 - 100 MG/DL Final   16/05/9603 97 70 - 100 MG/DL Final   54/03/8118 147 (H) 70 - 100 MG/DL Final          Problems Addressed Today  Encounter Diagnoses   Name Primary?   ? Coronary atherosclerosis due to calcified coronary lesion of native artery Yes   ? Hypercholesteremia    ? L Pop to tibial prineal bypass w/ endarterectomies 2018    ? NSTEMI (non-ST elevated myocardial infarction) (HCC)    ? PAD (peripheral artery disease) (HCC)    ? Family history of heart disease in male family member before age 56    ? Statin intolerance    ? Cigarette smoker one half pack a day or less        Assessment and Plan     Assessment:    1.  Coronary artery disease-no symptoms of angina and no use of sublingual nitroglycerin  2.  History of inferior wall MI in October 1995, s/p failed tPA, status post rescue PCI of the RCA  3.  CABG in April 2000 - LIMA to LAD, SVG to intermediate branch, sequential SVG to 2 obtuse marginal branches and the free radial graft to RCA  3.  History of non-ST MI in May 2022  4.  Status post LHC on 11/29/2020 -- patent LIMA to LAD, patent SVG to RPL V graft, occluded SVG to ramus and occluded SVG to obtuse marginal branches, patent native left circumflex and ramus intermedius with hemodynamically insignificant RFR across this vessels, occluded native RCA, patent radial graft to RPLV.  ? Patient was initiated on clopidogrel and present time continues on dual antiplatelet therapy  5.  PAD-no current symptoms of claudication, patient is able to walk  6.  S/p left lower extremity surgical revascularization in 2018, left popliteal endarterectomy, left anterior tibial atherectomy, left proximal popliteal to tibioperoneal trunk bypass  ? Status post lower extremity duplex in February 2022- patent flow in the left lower extremity, severe stenosis of the distal superficial right femoral artery with collateral circulation  7.  Primary hypertension- suboptimally controlled  8.  History of tobacco use-patient reports that he quit tobacco products in May 2022  9.  Weight gain due to dietary indiscretion  10.  Status post COVID-19 upper respiratory syndrome- patient recovered well  11.  History of influenza A x2 earlier this year-currently no symptoms    Plan:    1.  Continue all current medications, I did not make any changes  2.  I recommended overall risk factors modifications, weight reduction, low-fat, low-cholesterol, low sugar, low carbohydrate and low fat diet, regular physical activity.  3.  Follow-up in 10 to 12 months    Total Time Today was 40 minutes in the following activities: Preparing to see the patient, Obtaining and/or reviewing separately obtained history, Performing a medically appropriate examination and/or evaluation, Counseling and educating the patient/family/caregiver, Ordering medications, tests, or procedures, Referring and communication with other health care professionals (when not separately reported), Documenting  clinical information in the electronic or other health record, Independently interpreting results (not separately reported) and communicating results to the patient/family/caregiver and Care coordination (not separately reported)         Current Medications (including today's revisions)  ? aspirin EC 81 mg tablet Take one tablet by mouth at bedtime daily. Take with food.   ? carvediloL (COREG) 6.25 mg tablet Take one tablet by mouth twice daily. Take with food.   ? CHOLEcalciferoL (vitamin D3) (VITAMIN D3) 1,000 units tablet Take six tablets by mouth daily.   ? clopiDOGrel (PLAVIX) 75 mg tablet Take one tablet by mouth daily.   ? coenzyme Q10 100 mg cap Take one capsule by mouth daily.   ? evolocumab (REPATHA SURECLICK) 140 mg/mL injectable PEN Inject 1 mL under the skin every 14 days.   ? fluticasone propionate (FLONASE) 50 mcg/actuation nasal spray, suspension Apply two sprays to each nostril as directed daily.   ? nitroglycerin (NITROSTAT) 0.4 mg tablet Place one tablet under tongue every 5 minutes as needed for Chest Pain. Max of 3 tablets, call 911.   ? potassium gluconate 600 mg (99 mg) tab Take one tablet by mouth as Needed.   ? rosuvastatin (CRESTOR) 10 mg tablet Take one tablet by mouth daily.   ? SPIRIVA RESPIMAT 2.5 mcg/actuation inhaler Inhale one puff by mouth into the lungs daily.   ? SYMBICORT 160-4.5 mcg/actuation aerosol inhaler Inhale two puffs by mouth into the lungs twice daily.

## 2021-12-12 ENCOUNTER — Encounter: Admit: 2021-12-12 | Discharge: 2021-12-12 | Payer: Private Health Insurance - Indemnity

## 2022-01-05 ENCOUNTER — Encounter: Admit: 2022-01-05 | Discharge: 2022-01-05 | Payer: Private Health Insurance - Indemnity

## 2022-03-26 ENCOUNTER — Encounter: Admit: 2022-03-26 | Discharge: 2022-03-26 | Payer: Private Health Insurance - Indemnity

## 2022-05-29 ENCOUNTER — Encounter: Admit: 2022-05-29 | Discharge: 2022-05-29 | Payer: Private Health Insurance - Indemnity

## 2022-06-08 ENCOUNTER — Encounter: Admit: 2022-06-08 | Discharge: 2022-06-08

## 2022-06-08 DIAGNOSIS — Z951 Presence of aortocoronary bypass graft: Secondary | ICD-10-CM

## 2022-06-08 DIAGNOSIS — E785 Hyperlipidemia, unspecified: Secondary | ICD-10-CM

## 2022-06-08 DIAGNOSIS — R251 Tremor, unspecified: Secondary | ICD-10-CM

## 2022-06-08 DIAGNOSIS — R0600 Dyspnea, unspecified: Secondary | ICD-10-CM

## 2022-06-08 DIAGNOSIS — S66822A Laceration of other specified muscles, fascia and tendons at wrist and hand level, left hand, initial encounter: Secondary | ICD-10-CM

## 2022-06-08 MED ORDER — SULFAMETHOXAZOLE-TRIMETHOPRIM 800-160 MG PO TAB
1 | ORAL_TABLET | Freq: Two times a day (BID) | ORAL | 0 refills | Status: AC
Start: 2022-06-08 — End: ?
  Filled 2022-06-15: qty 20, 10d supply, fill #1

## 2022-06-08 MED ORDER — OXYCODONE 5 MG PO TAB
5 mg | ORAL_TABLET | ORAL | 0 refills | 6.00000 days | Status: AC | PRN
Start: 2022-06-08 — End: ?
  Filled 2022-06-15: qty 20, 5d supply, fill #1

## 2022-06-08 MED ORDER — LIDOCAINE HCL 10 MG/ML (1 %) IJ SOLN
15 mL | Freq: Once | INTRAMUSCULAR | 0 refills | Status: CP
Start: 2022-06-08 — End: ?
  Administered 2022-06-09: 03:00:00 15 mL via INTRAMUSCULAR

## 2022-06-08 MED ORDER — ACETAMINOPHEN 500 MG PO TAB
1000 mg | ORAL_TABLET | ORAL | 0 refills | Status: AC | PRN
Start: 2022-06-08 — End: ?

## 2022-06-08 MED ORDER — SULFAMETHOXAZOLE-TRIMETHOPRIM 800-160 MG PO TAB
1 | Freq: Once | ORAL | 0 refills | Status: CP
Start: 2022-06-08 — End: ?
  Administered 2022-06-09: 05:00:00 1 via ORAL

## 2022-06-08 MED ORDER — IBUPROFEN 600 MG PO TAB
600 mg | Freq: Once | ORAL | 0 refills | Status: CP
Start: 2022-06-08 — End: ?
  Administered 2022-06-09: 05:00:00 600 mg via ORAL

## 2022-06-08 MED ORDER — ACETAMINOPHEN 500 MG PO TAB
1000 mg | Freq: Once | ORAL | 0 refills | Status: CP
Start: 2022-06-08 — End: ?
  Administered 2022-06-09: 05:00:00 1000 mg via ORAL

## 2022-06-08 MED ORDER — SENNOSIDES-DOCUSATE SODIUM 8.6-50 MG PO TAB
1 | ORAL_TABLET | Freq: Two times a day (BID) | ORAL | 1 refills | Status: AC
Start: 2022-06-08 — End: ?

## 2022-06-08 MED ORDER — OXYCODONE 5 MG PO TAB
5 mg | Freq: Once | ORAL | 0 refills | Status: CP
Start: 2022-06-08 — End: ?
  Administered 2022-06-09: 05:00:00 5 mg via ORAL

## 2022-06-08 NOTE — Progress Notes
Transfer center note:    Left hand. Non dominant. Patient was using a table saw and it went through his Second and third distal digits.  Partial amputation.    "Obvious ligament injury."   Cannot flex the second digit.  Distal phlanx of the third digit.    "Most likely a ligament injury."

## 2022-06-09 ENCOUNTER — Emergency Department: Admit: 2022-06-09 | Discharge: 2022-06-09 | Disposition: A | Discharge status: Home / Self Care

## 2022-06-09 ENCOUNTER — Encounter: Admit: 2022-06-09 | Discharge: 2022-06-09 | Payer: MEDICARE

## 2022-06-09 ENCOUNTER — Ambulatory Visit: Admit: 2022-06-09 | Discharge: 2022-06-09 | Payer: MEDICARE

## 2022-06-09 DIAGNOSIS — S61402A Unspecified open wound of left hand, initial encounter: Secondary | ICD-10-CM

## 2022-06-09 DIAGNOSIS — S6992XA Unspecified injury of left wrist, hand and finger(s), initial encounter: Secondary | ICD-10-CM

## 2022-06-09 NOTE — ED Notes
71 yo M present to ED w/ cc of finger injury. Transfer from [facility], seeing hand. Ran index and middle finger through table saw on left hand. Open wound to 2nd an 3rd finger on Lt hand. Bleeding controlled. Pt had tdap vaccine administered 2 weeks ago. Bleeding controlled. Plastics to see pt. Pt A&Ox4 resting in bed, respirations even and non-labored. Skin is warm, dry, intact and appropriate for ethnicity. Pt hooked up to monitors. Bed is in lowest locked position, call light within reach.

## 2022-06-09 NOTE — Other
Minor Procedure    Date of Procedure:  06/08/2022     Pre-op Diagnosis:  L IF and MF laceration    Procedure: Laceration repair    Indications: Mitchell Krause is a 71 y.o. male who presents for L IF and MF laceration repair.   He understands all risks, benefits, indications, potential complications, and alternatives, and freely consents to the procedure. He also understands the option of performing no surgery, the risk for scarring, and the technique of the procedure.    Location: L IF and MF    Anesthesia: Local    Technique: After informed consent was obtained, 6 mL of 1% lidocaine with epinephrine was injected for L IF and MF digital blocks. After sufficient time for anesthesia, we began by thoroughly irrigating the wound with 500 mL of sterile saline irrigation under pressure of 60 cc syringe with splash guard. The skin was then prepped with betadine and draped in the usual sterile fashion. The wounds were explored and both the left index and middle finger FDP tendons were found to be completely transected at the level of the DIP joint. Attention was then turned to the lacerations and the skin was approximated with 3-0 chromic in simple interrupted fashion.   A soft dressing was applied and wound care instructions were provided. He tolerated the procedure well and without complications. He  will be alert for any signs of cutaneous infection and will follow up as instructed.

## 2022-06-09 NOTE — ED Notes
Pt A&Ox4 and given discharge instructions and follow up information. Pt verbalizes understanding and has no further questions or complaints. Pt refused D/C VS. Pt ambulated out of ED w/ steady gait to go home by POV w/ wife driving. Pt provide w/ wound care supplies

## 2022-06-09 NOTE — Unmapped
Thank you for choosing [facility] and the Department of Emergency Medicine for your healthcare needs.    You were seen in the Emergency Department for lacerations to your left index and middle fingers with probable tendon injury. Please keep your dressings clean, dry, and intact until seen by Dr. Jen Mow in hand clinic early next week.     You may alternate tylenol and ibuprofen for pain control. Take oxycodone as needed. Be sure to take over the counter stool softeners or laxatives while taking narcotic pain medication to avoid constipation. You were also prescribed an antibiotic, please take as instructed.     If you do not hear from clinic by Friday 11/10 please call [phone] to schedule an appointment. If you develop fevers, chills, malaise, or worsening pain or swelling of your hand please present to ED for re-evaluation.

## 2022-06-12 ENCOUNTER — Ambulatory Visit: Admit: 2022-06-12 | Discharge: 2022-06-13 | Payer: MEDICARE

## 2022-06-12 ENCOUNTER — Encounter: Admit: 2022-06-12 | Discharge: 2022-06-12 | Payer: MEDICARE

## 2022-06-12 DIAGNOSIS — R251 Tremor, unspecified: Secondary | ICD-10-CM

## 2022-06-12 DIAGNOSIS — R0600 Dyspnea, unspecified: Secondary | ICD-10-CM

## 2022-06-12 DIAGNOSIS — I252 Old myocardial infarction: Secondary | ICD-10-CM

## 2022-06-12 DIAGNOSIS — Z951 Presence of aortocoronary bypass graft: Secondary | ICD-10-CM

## 2022-06-12 DIAGNOSIS — E785 Hyperlipidemia, unspecified: Secondary | ICD-10-CM

## 2022-06-12 NOTE — Unmapped
[facility] Anesthesia Pre-Procedure Evaluation    Name: Mitchell Krause      MRN: [id number]     DOB: 12-19-1950     Age: 71 y.o.     Sex: male   _________________________________________________________________________     Procedure Info:   Procedure Information     Date/Time: 06/15/22 1330    Procedure: REPAIR/ ADVANCEMENT FLEXOR TENDON NOT IN ZONE 2 DIGITAL FLEXOR TENDON SHEATH - PRIMARY/  SECONDARY - EACH TENDON 45409W1 (Left) - CASE LENGTH 75 MINUTES    Location: MAIN OR 19 / Main OR/Periop    Surgeons: Nicky Pugh, MD          Physical Assessment  Vital Signs (last filed in past 24 hours):  Height: 180.3 cm (5' 11) (11/13 1914)  Weight: 110.7 kg (244 lb) (11/13 0914)      Patient History   Allergies   Allergen Reactions   ? Lipitor [Atorvastatin] MENTAL STATUS CHANGES     Patient reports memory loss   ? Mevacor [Lovastatin] MENTAL STATUS CHANGES     Patient reports memory loss   ? Pravastatin MENTAL STATUS CHANGES     Patient reports memory loss   ? Zocor [Simvastatin] MENTAL STATUS CHANGES     Patient reports memory loss        Current Medications    Medication Directions   acetaminophen (ACETAMINOPHEN EXTRA STRENGTH) 500 mg tablet Take two tablets by mouth every 6 hours as needed for Pain. Max of 4,000 mg of acetaminophen in 24 hours.   aspirin EC 81 mg tablet Take one tablet by mouth at bedtime daily. Take with food.   CHOLEcalciferoL (vitamin D3) (VITAMIN D3) 1,000 units tablet Take six tablets by mouth daily.   evolocumab (REPATHA SURECLICK) 140 mg/mL injectable PEN Inject 1 mL under the skin every 14 days.   fluticasone propionate (FLONASE) 50 mcg/actuation nasal spray, suspension Apply two sprays to each nostril as directed daily as needed.   nitroglycerin (NITROSTAT) 0.4 mg tablet Place one tablet under tongue every 5 minutes as needed for Chest Pain. Max of 3 tablets, call 911.   oxyCODONE (ROXICODONE) 5 mg tablet Take one tablet by mouth every 6 hours as needed for Pain.   potassium gluconate 600 mg (99 mg) tab Take one tablet by mouth as Needed.   rosuvastatin (CRESTOR) 10 mg tablet Take one tablet by mouth daily.  Patient taking differently: Take one tablet by mouth every morning.   sennosides-docusate sodium (SENOKOT-S) 8.6/50 mg tablet Take one tablet by mouth daily.   SPIRIVA RESPIMAT 2.5 mcg/actuation inhaler Inhale one puff by mouth into the lungs daily with lunch.   SYMBICORT 160-4.5 mcg/actuation aerosol inhaler Inhale two puffs by mouth into the lungs twice daily.   trimethoprim/sulfamethoxazole (BACTRIM DS) 160/800 mg tablet Take one tablet by mouth twice daily for 7 days.       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 Doran Heater at [facility]      Cardiovascular       Recent diagnostic studies:          ECG and echocardiogram      Exercise tolerance: >4 METS (9.89 METs per DASI)       Beta Blocker therapy: Yes            Past MI (1995, 11/2020) non-STEMI, old (> 6 months)    Coronary artery disease      Coronary artery bypass graft ( x5 2000)          PVD ( L Pop to tibial prineal bypass w/ endarterectomies 2018)      Hyperlipidemia      Daily ASA and plavix    Followed by Dr. 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)      Constitution - negative       Physical Exam    Airway Findings      Mallampati: II      TM distance: >3 FB      Neck ROM: full      Mouth opening: good    Dental Findings:       Upper dentures and full    Neurological Findings: Alert and oriented x 3    Constitutional findings:       No acute distress       Diagnostic Tests  Hematology:   Lab Results   Component Value Date    HGB 15.3 11/30/2020    HCT 44.4 11/30/2020    PLTCT 294 11/30/2020    WBC 8.7 11/30/2020    NEUT 63 11/26/2020    ANC 7.93 11/26/2020    ALC 3.31 11/26/2020    MONA 7 11/26/2020    AMC 0.92 11/26/2020    EOSA 1 11/26/2020    ABC 0.18 11/26/2020    MCV 94.0 11/30/2020    MCH 32.4 11/30/2020    MCHC 34.4 11/30/2020    MPV 7.3 11/30/2020    RDW 13.3 11/30/2020         General Chemistry:   Lab Results   Component Value Date    NA 142 11/30/2020    K 4.8 11/30/2020    CL 105 11/30/2020    CO2 29 11/30/2020    GAP 8 11/30/2020    BUN 15 11/30/2020    CR 1.07 11/30/2020    GLU 92 11/30/2020    CA 8.9 11/30/2020    ALBUMIN 4.3 11/26/2020    MG 2.2 11/30/2020    TOTBILI 0.6 11/26/2020      Coagulation:   Lab Results   Component Value Date    PT 11.6 11/26/2020    PTT 42.4 11/30/2020    INR 1.0 11/26/2020     Echocardiogram 10/2020  Interpretation Summary  ? LVEF=55% With Mild Abnormal Septal Motion But No Regional Wall Motion Abnormality  ? Mild Concentric LVH  ? Normal Chamber Dimensions  ? Mild Aortic Valve Sclerosis  ? TAPSE=1.47cm  ? No Pericardial Effusion    Cath 11/2020  FINAL IMPRESSION:    1. Patent  LIMA-LAD, patent SVG-RPLV graft.  2. Occluded SVG-ramus and SVG-OM grafts, but with patent native left circumflex artery and native ramus intermedius, with hemodynamically insignificant RFR across these vessels.    3. Occluded native RCA, but with patent radial graft to the RPLV.  4. LVEDP 6 mmHg.  RECOMMENDATIONS:    1. Medical management.  2. Risk-factor modification.      PAC Plan    Interview: Telehealth Interview    ASA Score: 3    Anesthesia Options Discussed: Regional                  DOS Orders:  CBC and BMP            PAC RISK ASSESSMENTS:   *[facility] Activity Status Index (DASI): 58.2  , Calculated METS: 9.89   (score < 25 correlates with increased risk for death, MI, and moderate to severe complications, max score 57)  *STOP-BANG Score: 4   (total 5-8 high risk for OSA, 3-4 with HCO3 >28 also high risk. OSA associated with greater than twice the odds for respiratory failure, cardiac events, ICU admission, and difficult intubation)        Alerts

## 2022-06-12 NOTE — Pre-Anesthesia Patient Instructions
PREPROCEDURE INFORMATION    Arrival at the hospital  [facility]  [address]  [place], [place] [address]    Park in the [employer], located directly across from the main entrance to the hospital.  Enter through the ground floor main hospital entrance and check in at the Information Desk in the lobby.  They will validate your parking ticket and direct you to the next location.    You will receive a call with your surgery arrival time between 2:30pm and 4:30pm the last business day before your procedure.  If you do not receive a call, please call [phone] before 4:30pm or [phone] after 4:30pm.    Eating or drinking before surgery  Nothing to eat after 11:00pm the night before your surgery including gum, mints and candy. You may have clear liquids up to 2 hours before your surgery time.     If you have received specific instructions from your surgeon, please follow those.     Clear Liquid Examples:   Water  Clear juice - Apple or cranberry (no pulp or orange juice) - If diabetic blood sugar must be <200  Coffee and tea with or without sugar (no cream)   Sports drinks - Powerade/Gatorade   Soda   Bowel Prep solutions only if ordered by your surgeon       Planning transportation for outpatient procedure  For your safety, you will need to arrange for a responsible ride/person to accompany you home due to sedation or anesthesia with your procedure.  An [employer], taxi or other public transportation driver is not considered a responsible person to accompany you home.    Bath/Shower Instructions  Take a bath or shower with antibacterial soap the night before and the morning of your procedure. Use clean towels.  Put on clean clothes after bath or shower.  Avoid using lotion and oils.  If you are having surgery above the waist, wear a shirt that fastens up the front.  Sleep on clean sheets if bath or shower is done the night before procedure.    Morning of your procedure:  Brush your teeth and tongue  Do not smoke, vape, chew or user any tobacco products.  Do not shave the area where you will have surgery.  Remove nail polish, makeup and all jewelry (including piercings) before coming to the hospital.  Dress in clean, loose, comfortable clothing.    Valuables  Leave money, credit cards, jewelry, and any other valuables at home. [facility] is not responsible for the loss or breakage of personal items.    What to bring to the hospital  ID/Insurance card  Medical Device card  Official documents for legal guardianship  Copy of your Living Will, Advanced Directives, and/or Durable Power of Attorney. If you have these documents, please bring them to the admissions office on the day of your surgery to be scanned into your records.  Do not bring medications from home unless instructed by a pharmacist.  CPAP/BiPAP machine (including all supplies)  Walker, cane, or motorized scooter  Cases for glasses/hearing aids/contact lens (bring solutions for contacts)     Notify us at [facility]: [phone] on the day of your procedure if:  You need to cancel your procedure.  You are going to be late.    Notify your surgeon if:  There is a possibility that you are pregnant.  You become ill with a cough, fever, sore throat, nausea, vomiting or  flu-like symptoms.  You have any open wounds/sores that are red, painful, draining, or are new since you last saw the doctor.  You need to cancel your procedure.    Preparing to get your medications at discharge  Your surgeon may prescribe you medications to take after your procedure.  If you like the convenience of having your medications filled here at [facility], please do the following:  Go to [facility] pharmacy after your [facility] appointment to put a credit card on file.  Call [facility] at [phone] (Monday-Friday 7am-9pm or Saturday and Sunday 9am-5pm) to put a credit card on file.  Bring a credit card or cash on the day of your procedure- please leave with a family member rather than bringing it into the preop area.    Current Visitor Policy:  Visitors must be free of fever and symptoms to be in our facilities.  No more than 2 visitors per patient are allowed.  Additional guidelines may vary, based on patient care area or patient's condition.  Patients in semiprivate rooms may have visitors, but visits should be coordinated so only two total visitors are in a room at a time due to space limitations.  Children younger than age 51 are allowed to visit inpatients.    Thank you for participating in your Preoperative Assessment Clinic visit today.    If you have any changes to your health or hospitalizations between now and your surgery, please call us at [phone] for Main instructions.    Instructions given to patient via: verbal

## 2022-06-13 DIAGNOSIS — Z01818 Encounter for other preprocedural examination: Secondary | ICD-10-CM

## 2022-06-15 ENCOUNTER — Encounter: Admit: 2022-06-15 | Discharge: 2022-06-15 | Payer: MEDICARE

## 2022-06-15 ENCOUNTER — Ambulatory Visit: Admit: 2022-06-15 | Discharge: 2022-06-15 | Payer: MEDICARE

## 2022-06-15 DIAGNOSIS — R251 Tremor, unspecified: Secondary | ICD-10-CM

## 2022-06-15 DIAGNOSIS — I252 Old myocardial infarction: Secondary | ICD-10-CM

## 2022-06-15 DIAGNOSIS — Z951 Presence of aortocoronary bypass graft: Secondary | ICD-10-CM

## 2022-06-15 DIAGNOSIS — R0600 Dyspnea, unspecified: Secondary | ICD-10-CM

## 2022-06-15 DIAGNOSIS — E785 Hyperlipidemia, unspecified: Secondary | ICD-10-CM

## 2022-06-15 MED ORDER — PROPOFOL INJ 10 MG/ML IV VIAL
INTRAVENOUS | 0 refills | Status: DC
Start: 2022-06-15 — End: 2022-06-15

## 2022-06-15 MED ORDER — PHENYLEPHRINE HCL IN 0.9% NACL 1 MG/10 ML (100 MCG/ML) IV SYRG
INTRAVENOUS | 0 refills | Status: DC
Start: 2022-06-15 — End: 2022-06-15

## 2022-06-15 MED ORDER — LIDOCAINE (PF) 20 MG/ML (2 %) IJ SOLN
0 refills | Status: CP
Start: 2022-06-15 — End: ?

## 2022-06-15 MED ORDER — CEFAZOLIN 1 GRAM IJ SOLR
INTRAVENOUS | 0 refills | Status: DC
Start: 2022-06-15 — End: 2022-06-15

## 2022-06-15 MED ORDER — LACTATED RINGERS IV SOLP
INTRAVENOUS | 0 refills | Status: DC
Start: 2022-06-15 — End: 2022-06-15

## 2022-06-15 MED ORDER — DEXAMETHASONE SODIUM PHOSPHATE 10 MG/ML IJ SOLN
0 refills | Status: CP
Start: 2022-06-15 — End: ?

## 2022-06-15 MED ORDER — ROPIVACAINE (PF) 5 MG/ML (0.5 %) IJ SOLN
0 refills | Status: CP
Start: 2022-06-15 — End: ?

## 2022-06-15 MED ORDER — LIDOCAINE (PF) 10 MG/ML (1 %) IJ SOLN
SUBCUTANEOUS | 0 refills | Status: CP
Start: 2022-06-15 — End: ?

## 2022-06-15 MED ORDER — FENTANYL CITRATE (PF) 50 MCG/ML IJ SOLN
INTRAVENOUS | 0 refills | Status: DC
Start: 2022-06-15 — End: 2022-06-15

## 2022-06-15 MED ORDER — PROPOFOL 10 MG/ML IV EMUL 100 ML (INFUSION)(AM)(OR)
INTRAVENOUS | 0 refills | Status: DC
Start: 2022-06-15 — End: 2022-06-15
  Administered 2022-06-15: 19:00:00 120 ug/kg/min via INTRAVENOUS

## 2022-06-15 MED FILL — CEPHALEXIN 500 MG PO CAP: 500 mg | ORAL | 10 days supply | Qty: 40 | Fill #1 | Status: CP

## 2022-06-21 ENCOUNTER — Encounter: Admit: 2022-06-21 | Discharge: 2022-06-21 | Payer: MEDICARE

## 2022-06-21 ENCOUNTER — Ambulatory Visit: Admit: 2022-06-21 | Discharge: 2022-06-22 | Payer: MEDICARE

## 2022-06-21 DIAGNOSIS — R251 Tremor, unspecified: Secondary | ICD-10-CM

## 2022-06-21 DIAGNOSIS — I252 Old myocardial infarction: Secondary | ICD-10-CM

## 2022-06-21 DIAGNOSIS — E785 Hyperlipidemia, unspecified: Secondary | ICD-10-CM

## 2022-06-21 DIAGNOSIS — Z951 Presence of aortocoronary bypass graft: Secondary | ICD-10-CM

## 2022-06-21 DIAGNOSIS — S56129S Laceration of flexor muscle, fascia and tendon of unspecified finger at forearm level, sequela: Secondary | ICD-10-CM

## 2022-06-21 DIAGNOSIS — R0600 Dyspnea, unspecified: Secondary | ICD-10-CM

## 2022-06-21 MED ORDER — OXYCODONE 5 MG PO TAB
5-10 mg | ORAL_TABLET | ORAL | 0 refills | 6.00000 days | Status: AC | PRN
Start: 2022-06-21 — End: ?

## 2022-06-21 MED ORDER — GABAPENTIN 300 MG PO CAP
300 mg | ORAL_CAPSULE | ORAL | 1 refills | Status: AC
Start: 2022-06-21 — End: ?

## 2022-06-21 NOTE — Progress Notes
Hand Therapy Orthosis/Evaluation and Plan of Care:     Name: Mitchell Krause, Mitchell Krause  DOB: 12/27/1950  MRN#: [id number]  Referring Physician: Comer Locket, MD  Insurance: Medicare  Injury/Onset Date: 06-08-22   Surgical Date: 06-15-22  Medical Diagnosis: Left index and long finger FDP repair, zone 1  Treatment Diagnosis:  Soft tissue injury  Date of Initial Evaluation: 06-21-22  Follow-up Physician Appointment: 07-26-22  Visit #  1      Subjective:    Primary Concern/Chief Complaint:  Flexor tendon injury    Mental Status/Cognitive Function: Alert/Able to Follow Instructions/Cooperative    Co-morbidities:  None    PMH/Functional Limitations:  None    Previous History of Similar Symptoms:  None    Previous Treatment for Similar Symptoms:  None    Current Functional Status:    ADL/Self-Care Impairment: Independent    Work Task Impairment: Moderate    Current Functional Limitations: Immobilization with cast/orthosis/prefabricated orthosis    Assistive Devices:  None    Durable Medical Equipment:  None    Employment/Work Status: Retired    Home Situation: Lives with Spouse    Home Health Care:  None    Home Environment: Not Relevant to UE injury/condition     History of Present Condition/Mechanism of Injury: table saw injury    Pain: Left:  Location: Fingers: Index and Long    Pain Rating:   Current: 3/10  To 4/100    Patient Goals:   Max functional use L UE     Objective:     Evaluation:    Hand Exam     Treatment:     Left:  Fingers: Index, Long, Ring and Small     Treatment Provided:    Therapeutic Exercise: Pt instructed in exercise per MD.  Pt demonstrated accurately.    PROM/Duran  Place and Hold - gentle    Education:Handout Issued: Hand Therapy Exercise Protocols: Flexor Tendon Program/Fingers             Orthosis:     Orthosis Fabricated: Forearm-Based -  Dorsal Blocking Wrist/Fingers     Purpose of Immobilization:To protect surgical procedure    Orthosis Instructions:  Verbally instructed in orthosis/brace care and precautions. Verbally instructed in wearing schedule for orthosis/brace. At all times.  Pt verbalized understanding.      Assessment:     Problems: Soft Tissue Injury    Rationale for Therapy:Clinical Judgment and Per Physician Order/Referral    Rehab Potential: Good     Contraindications to Therapy:none    Long Term Treatment Goals:    Increase AROM while decreasing stiffness, swelling and pain to improve functional performance.    Short Term Treatment Goals:      Goal Number:  1  Goal: Patient to verbalize/demonstrate exercises correctly.  Goal Status:  Met   Goal Number:  2  Goal: Patient to verbalize understanding of orthosis instructions including wearing schedule and precautions.  Goal Status:  Met     Plan Of Care:    Treatment Plan:     Treatment to be provided:Orthosis Fabrication  Exercise: PROM/Duran  Place and Hold    Continue plan of care/treatment to continue progress toward established goals. Frequency of treatment:  Will follow in one week.  May initiate AROM at 4 weeks post-op per MD.  Pt may want to have therapy closer to home.

## 2022-06-21 NOTE — Progress Notes
Date of Service: 06/21/2022    Subjective:             Mitchell Krause is a 71 y.o. male.    History of Present Illness  Here to follow-up.  Overall doing well.  Getting electrical/shocking pain in his arm at times     Review of Systems   Constitutional: Negative.    HENT: Negative.    Eyes: Negative.    Respiratory: Negative.    Cardiovascular: Negative.    Gastrointestinal: Negative.    Endocrine: Negative.    Genitourinary: Negative.    Musculoskeletal: Negative.    Skin: Negative.    Allergic/Immunologic: Negative.    Neurological: Negative.    Hematological: Negative.    Psychiatric/Behavioral: Negative.    All other systems reviewed and are negative.        Objective:         ? acetaminophen (ACETAMINOPHEN EXTRA STRENGTH) 500 mg tablet Take two tablets by mouth every 6 hours as needed for Pain. Max of 4,000 mg of acetaminophen in 24 hours.   ? aspirin EC 81 mg tablet Take one tablet by mouth at bedtime daily. Take with food.   ? cephalexin (KEFLEX) 500 mg capsule Take one capsule by mouth four times daily for 10 days.   ? CHOLEcalciferoL (vitamin D3) (VITAMIN D3) 1,000 units tablet Take six tablets by mouth daily.   ? evolocumab (REPATHA SURECLICK) 140 mg/mL injectable PEN Inject 1 mL under the skin every 14 days.   ? fluticasone propionate (FLONASE) 50 mcg/actuation nasal spray, suspension Apply two sprays to each nostril as directed daily as needed.   ? nitroglycerin (NITROSTAT) 0.4 mg tablet Place one tablet under tongue every 5 minutes as needed for Chest Pain. Max of 3 tablets, call 911.   ? oxyCODONE (ROXICODONE) 5 mg tablet Take one tablet by mouth every 6 hours as needed for Pain.   ? potassium gluconate 600 mg (99 mg) tab Take one tablet by mouth as Needed.   ? rosuvastatin (CRESTOR) 10 mg tablet Take one tablet by mouth daily. (Patient taking differently: Take one tablet by mouth every morning.)   ? sennosides-docusate sodium (SENOKOT-S) 8.6/50 mg tablet Take one tablet by mouth daily.   ? SPIRIVA RESPIMAT 2.5 mcg/actuation inhaler Inhale one puff by mouth into the lungs daily with lunch.   ? SYMBICORT 160-4.5 mcg/actuation aerosol inhaler Inhale two puffs by mouth into the lungs twice daily.   ? trimethoprim/sulfamethoxazole (BACTRIM DS) 160/800 mg tablet Take one tablet by mouth twice daily for 10 days.     Vitals:    06/21/22 0842   BP: (!) 131/93   BP Source: Arm, Right Upper   Pulse: 77   SpO2: 95%   PainSc: Four   Weight: 106.6 kg (235 lb)   Height: 180.3 cm (5' 11)     Body mass index is 32.78 kg/m?Marland Kitchen     Physical Exam  Fingers in good cascade, some edema, buttons in place.       Assessment and Plan:  Status post middle/ring finger FDP repair over a button  Sent to therapy today for dorsal block splint and for stage exercises  Given therapy order to take closer to home for flexor tendon protocol  Follow-up next week for suture removal with Cody, follow-up with me in 5 weeks for button removal  Refilled oxycodone, starting gabapentin for nerve pain.

## 2022-06-22 DIAGNOSIS — S61209S Unspecified open wound of unspecified finger without damage to nail, sequela: Secondary | ICD-10-CM

## 2022-06-22 DIAGNOSIS — S56129S Laceration of flexor muscle, fascia and tendon of unspecified finger at forearm level, sequela: Secondary | ICD-10-CM

## 2022-06-22 DIAGNOSIS — S61402A Unspecified open wound of left hand, initial encounter: Secondary | ICD-10-CM

## 2022-06-22 DIAGNOSIS — S66822A Laceration of other specified muscles, fascia and tendons at wrist and hand level, left hand, initial encounter: Secondary | ICD-10-CM

## 2022-06-30 ENCOUNTER — Encounter: Admit: 2022-06-30 | Discharge: 2022-06-30 | Payer: MEDICARE

## 2022-06-30 ENCOUNTER — Ambulatory Visit: Admit: 2022-06-30 | Discharge: 2022-06-30 | Payer: MEDICARE

## 2022-06-30 DIAGNOSIS — S56129S Laceration of flexor muscle, fascia and tendon of unspecified finger at forearm level, sequela: Secondary | ICD-10-CM

## 2022-06-30 DIAGNOSIS — S6992XA Unspecified injury of left wrist, hand and finger(s), initial encounter: Secondary | ICD-10-CM

## 2022-06-30 NOTE — Progress Notes
Hand Therapy Daily Note:    Name: Mitchell Krause, Mitchell Krause"  DOB: 03/27/1951  MRN#: [id number]  Referring Physician: Joycelyn Schmid, MD  Insurance: Medicare  Injury/Onset Date: 06-08-22            Surgical Date:    06-15-22  Medical Diagnosis: Left index and long finger FDP repair, zone 1  Treatment Diagnosis:  Soft tissue injury  Date of Initial Evaluation: 06-21-22  Date of Last Evaluation: 06-30-22  Follow-up Physician Appointment: 07-26-22  Visit #  2    Subjective:  Pt states he has been doing his exercises.      Objective:    Evaluation:     Hand Exam     Treatment:     Left:  Fingers: Index and Long      Treatment Provided:    Therapeutic Exercise: Exercises reviewed.  Pt to continue with previous home exercise program.    PROM/Duran  Place and Hold    Orthosis:    Orthosis Adjusted: Forearm-Based -  Dorsal Blocking Wrist/Fingers adjusted to improve comfort and PIP extension within splint.      Purpose of Immobilization:To protect surgical procedure    Orthosis Instructions:  Verbally instructed in orthosis/brace care and precautions. Verbally instructed in wearing schedule for orthosis/brace. At all times.  Pt verbalized understanding.      Assessment:    STG Goals:    Goal Number:  3  Goal: Patient to verbalize/demonstrate exercises correctly.  Goal Status:  Met      Patient's tolerance of treatment: Good    Plan:    Continue plan of care/treatment to continue progress toward established goals. Frequency of treatment:  Will follow in two weeks.

## 2022-06-30 NOTE — Progress Notes
Patient presents for suture removal. The wound is well healed without signs of infection.  The sutures are removed. Return to see Dr. Jen Mow at scheduled appointment on 07/26/2022.

## 2022-07-10 ENCOUNTER — Encounter: Admit: 2022-07-10 | Discharge: 2022-07-10 | Payer: MEDICARE

## 2022-07-14 ENCOUNTER — Encounter: Admit: 2022-07-14 | Discharge: 2022-07-14 | Payer: MEDICARE

## 2022-07-14 DIAGNOSIS — S56129D Laceration of flexor muscle, fascia and tendon of unspecified finger at forearm level, subsequent encounter: Secondary | ICD-10-CM

## 2022-07-14 DIAGNOSIS — S56129S Laceration of flexor muscle, fascia and tendon of unspecified finger at forearm level, sequela: Secondary | ICD-10-CM

## 2022-07-14 MED ORDER — REPATHA SURECLICK 140 MG/ML SC PNIJ
140 mg | SUBCUTANEOUS | 3 refills | 28.00000 days | Status: AC
Start: 2022-07-14 — End: ?

## 2022-07-14 NOTE — Progress Notes
Hand Therapy Daily Note:    Name: Mitchell Krause, Mitchell Krause"  DOB: 07/26/1951  MRN#: [id number]  Referring Physician: Joycelyn Schmid, MD  Insurance: Medicare  Injury/Onset Date: 06-08-22            Surgical Date:    06-15-22  Medical Diagnosis: Left index and long finger FDP repair, zone 1  Treatment Diagnosis:  Soft tissue injury  Date of Initial Evaluation: 06-21-22  Date of Last Evaluation: 07-14-22  Follow-up Physician Appointment: 07-26-22  Visit #  3    Subjective:  Pt states he continues with his exercises but feels his fingers are very stiff still and swollen.     Objective:    Evaluation:     Hand Exam     Treatment:     Left:  Fingers: Index and Long      Treatment Provided:    Therapeutic Exercise: Pt demonstrated all exercises accurately.   AROM initiated to fingers with wrist in neutral only.   No wrist extension with finger exercises.    PROM/Passive - initiated to PIP joints of index and long with MCP in max flexion.    PROM/Duran - continue  Place and Hold - continue    Edema Management: Coban wrap - Index and Long - pt to leave coban off during the day to make exercises easier.  Pt to use coban wrap at night for finger edema.  Pt instructed in coban wear / application / removal and precautions with understanding indicated.     Education:Handout Issued: Finger AROM Exercises    Orthosis:    Pt continues wear of dorsal blocking splint and it is of good fit and no pressure areas noted.  Pt may remove splint for exercise but to wear at all other times.      Assessment:    STG Goals:    Goal Number:  4  Goal: Patient to verbalize/demonstrate exercises correctly.  Goal Status:  Met   Goal Number:  5  Goal: Patient to verbalize understanding of instructions/precautions regarding compression.  Goal Status:  Met      Patient's tolerance of treatment: Good    Plan:    Continue plan of care/treatment to continue progress toward established goals. Frequency of treatment:  Will follow in two weeks.

## 2022-07-26 ENCOUNTER — Encounter: Admit: 2022-07-26 | Discharge: 2022-07-26 | Payer: MEDICARE

## 2022-07-26 ENCOUNTER — Ambulatory Visit: Admit: 2022-07-26 | Discharge: 2022-07-27 | Payer: MEDICARE

## 2022-07-26 DIAGNOSIS — R0600 Dyspnea, unspecified: Secondary | ICD-10-CM

## 2022-07-26 DIAGNOSIS — S56129S Laceration of flexor muscle, fascia and tendon of unspecified finger at forearm level, sequela: Secondary | ICD-10-CM

## 2022-07-26 DIAGNOSIS — Z951 Presence of aortocoronary bypass graft: Secondary | ICD-10-CM

## 2022-07-26 DIAGNOSIS — E785 Hyperlipidemia, unspecified: Secondary | ICD-10-CM

## 2022-07-26 DIAGNOSIS — I252 Old myocardial infarction: Secondary | ICD-10-CM

## 2022-07-26 DIAGNOSIS — R251 Tremor, unspecified: Secondary | ICD-10-CM

## 2022-07-26 NOTE — Progress Notes
therapy order faxed to [facility] in [place] at fax [phone]

## 2022-07-26 NOTE — Progress Notes
Date of Service: 07/26/2022    Subjective:             Mitchell Krause is a 71 y.o. male.    History of Present Illness  Here to follow-up.  Not wearing splint today.  Says it will not stay on.  Has not gone to therapy at home, but has been to therapy here at [facility] a couple of times.     Review of Systems   Constitutional: Negative.    HENT:  Positive for tinnitus.    Eyes: Negative.    Respiratory: Negative.     Cardiovascular: Negative.    Gastrointestinal: Negative.    Endocrine: Negative.    Genitourinary: Negative.    Musculoskeletal: Negative.    Skin: Negative.    Allergic/Immunologic: Negative.    Neurological: Negative.    Hematological: Negative.    Psychiatric/Behavioral: Negative.     All other systems reviewed and are negative.      Objective:          acetaminophen (ACETAMINOPHEN EXTRA STRENGTH) 500 mg tablet Take two tablets by mouth every 6 hours as needed for Pain. Max of 4,000 mg of acetaminophen in 24 hours.    aspirin EC 81 mg tablet Take one tablet by mouth at bedtime daily. Take with food.    CHOLEcalciferoL (vitamin D3) (VITAMIN D3) 1,000 units tablet Take six tablets by mouth daily.    evolocumab (REPATHA SURECLICK) 140 mg/mL injectable PEN INJECT 1 ML UNDER THE SKIN EVERY 14 DAYS    fluticasone propionate (FLONASE) 50 mcg/actuation nasal spray, suspension Apply two sprays to each nostril as directed daily as needed.    nitroglycerin (NITROSTAT) 0.4 mg tablet Place one tablet under tongue every 5 minutes as needed for Chest Pain. Max of 3 tablets, call 911.    oxyCODONE (ROXICODONE) 5 mg tablet Take one tablet to two tablets by mouth every 6 hours as needed for Pain.    potassium gluconate 600 mg (99 mg) tab Take one tablet by mouth as Needed.    rosuvastatin (CRESTOR) 10 mg tablet Take one tablet by mouth daily. (Patient taking differently: Take one tablet by mouth every morning.)    sennosides-docusate sodium (SENOKOT-S) 8.6/50 mg tablet Take one tablet by mouth daily.    SPIRIVA RESPIMAT 2.5 mcg/actuation inhaler Inhale one puff by mouth into the lungs daily with lunch.    SYMBICORT 160-4.5 mcg/actuation aerosol inhaler Inhale two puffs by mouth into the lungs twice daily.     Vitals:    07/26/22 0806   BP: (!) 140/89   BP Source: Arm, Right Upper   Pulse: 79   SpO2: 97%   PainSc: Zero   Weight: 106.6 kg (235 lb)   Height: 180.3 cm (5' 11)     Body mass index is 32.78 kg/m?Marland Kitchen     Physical Exam  Left index and middle finger buttons in place, difficult to activate index and middle finger flexors       Assessment and Plan:  Status post left index and middle finger FDP repair over a button  Buttons removed  Updated therapy order sent  Reviewed with the patient his hand is not ready for normal activity, still needs some level of protection, counseled need to work closely with therapy  Follow-up with me in 6 weeks

## 2022-07-26 NOTE — Progress Notes
Hand Therapy Daily Note:    Name: Mitchell Krause, Mitchell Krause  DOB: 03/11/1951  MRN#: [id number]  Referring Physician: Comer Locket, MD  Insurance: Medicare  Injury/Onset Date: 06-08-22            Surgical Date:    06-15-22  Medical Diagnosis: Left index and long finger FDP repair, zone 1  Treatment Diagnosis:  Soft tissue injury  Date of Initial Evaluation: 06-21-22  Date of Last Evaluation: 07-14-22  Follow-up Physician Appointment: 07-26-22  Visit #  4    Subjective:   Pt states his fingers still are very swollen and that gets in the way of his motion.     Objective:    Evaluation:     Hand Exam           NORMS/  FINGER   THUMB/FINGER THUMB INDEX LONG RING SMALL    MCP   4 - 73  5 - 63   0/85   PIP  16 - 45  8 - 42   0/110   DIP  12 - 22  4 - 15   0/65   TAM    108    103   260   CHANGES              Treatment:     Left:  Fingers: Index, Long, Ring, and Small      Treatment Provided:    Therapeutic Exercise:   AROM  PROM/Passive  Joint Blocking initiated for fingers.  Pt to stabilize fingers for joint blocking on sides of fingers and not on volar surface.    Place and Hold  Ace wrap issued for flexion wrap at conclusion of exercises for 20 min 1-2 X daily.  Pt demonstrated accurately.   Pt instructed in precautions of no resistance or grip strengthening at this time.      Edema Management: Coban wrap - Index and Long at night.      Scar Management: Massage - manual massage as well as mini massager issued for scar / edema massage.  Pt demonstrated accurately.     Education:Handout Issued: Finger Joint Blocking Exercises    Orthosis:  Dorsal blocking splint D/C'd this date.      Prefabricated Orthosis Issued: Wrist Brace    Purpose of Immobilization:To protect during activity    Orthosis Instructions:  Verbally instructed in orthosis/brace care and precautions. Verbally instructed in wearing schedule for orthosis/brace. Other: only as needed for protection with activities, mostly as a reminder to not overdo.  Pt verbalized understanding.      Assessment:    STG Goals:    Goal Number:  6  Goal: Patient to verbalize/demonstrate exercises correctly.  Goal Status:  Met   Goal Number:  7  Goal: Patient to verbalize understanding of orthosis instructions including wearing schedule and precautions.  Goal Status:  Met      Patient's tolerance of treatment: Good    Plan:    Frequency of treatment:  To receive therapy at facility closer to home.  Radio broadcast assistant in [place])

## 2022-07-27 DIAGNOSIS — S56129S Laceration of flexor muscle, fascia and tendon of unspecified finger at forearm level, sequela: Secondary | ICD-10-CM

## 2022-07-27 DIAGNOSIS — S61209S Unspecified open wound of unspecified finger without damage to nail, sequela: Secondary | ICD-10-CM

## 2022-08-19 ENCOUNTER — Encounter: Admit: 2022-08-19 | Discharge: 2022-08-19 | Payer: MEDICARE

## 2022-08-22 ENCOUNTER — Encounter: Admit: 2022-08-22 | Discharge: 2022-08-22 | Payer: MEDICARE

## 2022-08-23 ENCOUNTER — Encounter: Admit: 2022-08-23 | Discharge: 2022-08-23 | Payer: MEDICARE

## 2022-08-23 NOTE — Telephone Encounter
I called to schedule patient because of referral  to see Dr Delice Bison for leg pain. The schedule allowed me to schedule the appointment on 09/28/2022 but patient states he is in an extreme amount of pain to where it keeps him up at night. He also mentions he received some pain medication in [place] where he lives but it is almost gone. Patient is asking if this appointment for ABI and Office visit can be scheduled for a sooner appointment.

## 2022-08-23 NOTE — Telephone Encounter
I spoke with patient and moved his appointment to 2/1. He was offered a sooner appointment with our nurse practitioner but declined.

## 2022-08-31 ENCOUNTER — Encounter: Admit: 2022-08-31 | Discharge: 2022-08-31 | Payer: MEDICARE

## 2022-09-06 ENCOUNTER — Encounter: Admit: 2022-09-06 | Discharge: 2022-09-06 | Payer: MEDICARE

## 2022-09-06 ENCOUNTER — Ambulatory Visit: Admit: 2022-09-06 | Discharge: 2022-09-07 | Payer: MEDICARE

## 2022-09-06 DIAGNOSIS — I252 Old myocardial infarction: Secondary | ICD-10-CM

## 2022-09-06 DIAGNOSIS — Z951 Presence of aortocoronary bypass graft: Secondary | ICD-10-CM

## 2022-09-06 DIAGNOSIS — R0600 Dyspnea, unspecified: Secondary | ICD-10-CM

## 2022-09-06 DIAGNOSIS — E785 Hyperlipidemia, unspecified: Secondary | ICD-10-CM

## 2022-09-06 DIAGNOSIS — R251 Tremor, unspecified: Secondary | ICD-10-CM

## 2022-09-06 DIAGNOSIS — M779 Enthesopathy, unspecified: Secondary | ICD-10-CM

## 2022-09-06 NOTE — Progress Notes
Subjective:       Here to follow-up.  Frustrated by lack of progress.  Working in therapy.      Mitchell Krause is a 72 y.o. male.       Review of Systems   Constitutional: Negative.    HENT: Negative.     Eyes: Negative.    Respiratory: Negative.     Cardiovascular: Negative.    Gastrointestinal: Negative.    Endocrine: Negative.    Genitourinary: Negative.    Musculoskeletal: Negative.    Skin: Negative.    Allergic/Immunologic: Negative.    Neurological: Negative.    Hematological: Negative.    Psychiatric/Behavioral: Negative.         Objective:          acetaminophen (ACETAMINOPHEN EXTRA STRENGTH) 500 mg tablet Take two tablets by mouth every 6 hours as needed for Pain. Max of 4,000 mg of acetaminophen in 24 hours.    aspirin EC 81 mg tablet Take one tablet by mouth at bedtime daily. Take with food.    CHOLEcalciferoL (vitamin D3) (VITAMIN D3) 1,000 units tablet Take six tablets by mouth daily.    evolocumab (REPATHA SURECLICK) 140 mg/mL injectable PEN INJECT 1 ML UNDER THE SKIN EVERY 14 DAYS    fluticasone propionate (FLONASE) 50 mcg/actuation nasal spray, suspension Apply two sprays to each nostril as directed daily as needed.    nitroglycerin (NITROSTAT) 0.4 mg tablet Place one tablet under tongue every 5 minutes as needed for Chest Pain. Max of 3 tablets, call 911.    potassium gluconate 600 mg (99 mg) tab Take one tablet by mouth as Needed.    rosuvastatin (CRESTOR) 10 mg tablet Take one tablet by mouth daily. (Patient taking differently: Take one tablet by mouth every morning.)    sennosides-docusate sodium (SENOKOT-S) 8.6/50 mg tablet Take one tablet by mouth daily.    SPIRIVA RESPIMAT 2.5 mcg/actuation inhaler Inhale one puff by mouth into the lungs daily with lunch.    SYMBICORT 160-4.5 mcg/actuation aerosol inhaler Inhale two puffs by mouth into the lungs twice daily.     Vitals:    09/06/22 0753   BP: (!) 143/87   Pulse: 79   PainSc: Three   Weight: 108.9 kg (240 lb)   Height: 180.3 cm (5' 11)     Body mass index is 33.47 kg/m?Marland Kitchen     Physical Exam  Index finger and middle finger very stiff, some small amount of flexor tendon pull-through, passive range of motion fairly stiff as well       Assessment and Plan:  Status post zone 1 flexor tendon repair and index finger/middle finger    Making slow progress.  Counseled patient that this kind of time course is pretty typical for this kind of injury.  Counseled him to keep working in therapy.  We will reevaluate in 2 months.  Might need tenolysis, but would give him at least another 3 to 4 months before we make that decision.

## 2022-09-07 ENCOUNTER — Encounter: Admit: 2022-09-07 | Discharge: 2022-09-07 | Payer: MEDICARE

## 2022-09-07 MED ORDER — PRALUENT PEN 150 MG/ML SC PNIJ
150 mg | SUBCUTANEOUS | 11 refills | 28.00000 days | Status: AC
Start: 2022-09-07 — End: ?
  Filled 2022-09-07: qty 2, 28d supply, fill #1

## 2022-09-07 NOTE — Telephone Encounter
Patient came into the [facility] office with concerns of insurance denial of coverage of Repatha. Patient states the last doses he received cost over $600. Patient has been on [place] but has recently changed pharmacy plan on insurance. Talked with patient about checking cost of Praluent. Will send prescription to [facility].    Discussed with [facility].

## 2022-09-11 ENCOUNTER — Encounter: Admit: 2022-09-11 | Discharge: 2022-09-11 | Payer: MEDICARE

## 2022-09-12 MED FILL — PRALUENT PEN 150 MG/ML SC PNIJ: 150 mg/mL | SUBCUTANEOUS | 28 days supply | Qty: 2 | Fill #1 | Status: AC

## 2022-10-01 ENCOUNTER — Encounter: Admit: 2022-10-01 | Discharge: 2022-10-01 | Payer: MEDICARE

## 2022-10-05 ENCOUNTER — Encounter: Admit: 2022-10-05 | Discharge: 2022-10-05 | Payer: MEDICARE

## 2022-10-24 ENCOUNTER — Encounter: Admit: 2022-10-24 | Discharge: 2022-10-24 | Payer: MEDICARE

## 2022-10-24 MED FILL — PRALUENT PEN 150 MG/ML SC PNIJ: 150 mg/mL | SUBCUTANEOUS | 28 days supply | Qty: 2 | Fill #2 | Status: AC

## 2022-11-08 ENCOUNTER — Encounter: Admit: 2022-11-08 | Discharge: 2022-11-08 | Payer: MEDICARE

## 2022-11-08 ENCOUNTER — Ambulatory Visit: Admit: 2022-11-08 | Discharge: 2022-11-09 | Payer: MEDICARE

## 2022-11-08 DIAGNOSIS — M779 Enthesopathy, unspecified: Secondary | ICD-10-CM

## 2022-11-08 DIAGNOSIS — I252 Old myocardial infarction: Secondary | ICD-10-CM

## 2022-11-08 DIAGNOSIS — R0600 Dyspnea, unspecified: Secondary | ICD-10-CM

## 2022-11-08 DIAGNOSIS — E785 Hyperlipidemia, unspecified: Secondary | ICD-10-CM

## 2022-11-08 DIAGNOSIS — Z951 Presence of aortocoronary bypass graft: Secondary | ICD-10-CM

## 2022-11-08 DIAGNOSIS — R251 Tremor, unspecified: Secondary | ICD-10-CM

## 2022-11-08 NOTE — Progress Notes
Date of Service: 11/08/2022    Subjective:             Mitchell Krause is a 72 y.o. male.    History of Present Illness  Feels as though he is making slow progress and getting more motion in his fingers.  Working mostly on his own rather than with therapy.  Notes that he is getting nerve waking up pain especially at night.  Not currently taking the gabapentin.     Review of Systems   All other systems reviewed and are negative.      Objective:          acetaminophen (ACETAMINOPHEN EXTRA STRENGTH) 500 mg tablet Take two tablets by mouth every 6 hours as needed for Pain. Max of 4,000 mg of acetaminophen in 24 hours.    alirocumab (PRALUENT PEN) 150 mg/mL injectable PEN Inject 1 mL under the skin every 14 days.    aspirin EC 81 mg tablet Take one tablet by mouth at bedtime daily. Take with food.    CHOLEcalciferoL (vitamin D3) (VITAMIN D3) 1,000 units tablet Take six tablets by mouth daily.    fluticasone propionate (FLONASE) 50 mcg/actuation nasal spray, suspension Apply two sprays to each nostril as directed daily as needed.    nitroglycerin (NITROSTAT) 0.4 mg tablet Place one tablet under tongue every 5 minutes as needed for Chest Pain. Max of 3 tablets, call 911.    potassium gluconate 600 mg (99 mg) tab Take one tablet by mouth as Needed.    rosuvastatin (CRESTOR) 10 mg tablet Take one tablet by mouth daily. (Patient taking differently: Take one tablet by mouth every morning.)    sennosides-docusate sodium (SENOKOT-S) 8.6/50 mg tablet Take one tablet by mouth daily.    SPIRIVA RESPIMAT 2.5 mcg/actuation inhaler Inhale one puff by mouth into the lungs daily with lunch.    SYMBICORT 160-4.5 mcg/actuation aerosol inhaler Inhale two puffs by mouth into the lungs twice daily.     Vitals:    11/08/22 0759   BP: (!) 142/91   Pulse: 66   Weight: 108.9 kg (240 lb)   Height: 182.9 cm (6')     Body mass index is 32.55 kg/m?Marland Kitchen     Physical Exam  Left index finger and middle finger, excellent passive motion at PIP joints, moderate active motion of PIP joint, minimal active motion at DIP joint, a little bit of passive motion at DIP joints       Assessment and Plan:  Status post left index and middle finger FDP tendon repairs    Continues to make slow progress regarding active and passive range of motion.  Told the patient as long as we are making progress we will keep slowly working in that direction.  Suspect that we will need to do a tenolysis at some point.  Told him that my citation is at the next visit we make the decision to move forward, but I like to see him make a little bit more progress on his own before we move forward with surgery.  Counseled him regarding the nerve pain.  Told him that if he is starting to have significant nerve pain at night that we can do a single dose of gabapentin at night.  He will consider that.  He says he thinks he still has some at home so he might start that on his own.  If he needs a refill he will contact us.  We will see him back in 6  to 8 weeks to discuss moving forward with surgery.  His questions were answered.

## 2022-11-24 ENCOUNTER — Encounter: Admit: 2022-11-24 | Discharge: 2022-11-24 | Payer: MEDICARE

## 2022-11-27 ENCOUNTER — Encounter: Admit: 2022-11-27 | Discharge: 2022-11-27 | Payer: MEDICARE

## 2022-11-27 NOTE — Progress Notes
Pharmacy Benefits Investigation    Medication name: PRALUENT PEN 150 MG/ML SC PNIJ    The insurance requires a prior authorization for the medication. The prior authorization was submitted via CoverMyMeds.    PA number: [id number]    Lucie Leather  Specialty Pharmacy Patient Advocate

## 2022-11-27 NOTE — Progress Notes
Pharmacy Benefits Investigation    Medication name: PRALUENT PEN 150 MG/ML SC PNIJ    The prior authorization was approved for Mitchell Krause (PA number [id number]) from 11/27/2022 through 11/27/2023.    Lucie Leather  Specialty Pharmacy Patient Advocate

## 2022-11-28 ENCOUNTER — Encounter: Admit: 2022-11-28 | Discharge: 2022-11-28 | Payer: MEDICARE

## 2022-11-30 ENCOUNTER — Encounter: Admit: 2022-11-30 | Discharge: 2022-11-30 | Payer: MEDICARE

## 2022-11-30 MED FILL — PRALUENT PEN 150 MG/ML SC PNIJ: 150 mg/mL | SUBCUTANEOUS | 28 days supply | Qty: 2 | Fill #3 | Status: AC

## 2022-12-19 ENCOUNTER — Encounter: Admit: 2022-12-19 | Discharge: 2022-12-19 | Payer: MEDICARE

## 2022-12-20 ENCOUNTER — Encounter: Admit: 2022-12-20 | Discharge: 2022-12-20 | Payer: MEDICARE

## 2022-12-20 ENCOUNTER — Ambulatory Visit: Admit: 2022-12-20 | Discharge: 2022-12-21 | Payer: MEDICARE

## 2022-12-20 DIAGNOSIS — I252 Old myocardial infarction: Secondary | ICD-10-CM

## 2022-12-20 DIAGNOSIS — E785 Hyperlipidemia, unspecified: Secondary | ICD-10-CM

## 2022-12-20 DIAGNOSIS — J984 Other disorders of lung: Secondary | ICD-10-CM

## 2022-12-20 DIAGNOSIS — R251 Tremor, unspecified: Secondary | ICD-10-CM

## 2022-12-20 DIAGNOSIS — M1990 Unspecified osteoarthritis, unspecified site: Secondary | ICD-10-CM

## 2022-12-20 DIAGNOSIS — R0600 Dyspnea, unspecified: Secondary | ICD-10-CM

## 2022-12-20 DIAGNOSIS — H269 Unspecified cataract: Secondary | ICD-10-CM

## 2022-12-20 DIAGNOSIS — T7840XA Allergy, unspecified, initial encounter: Secondary | ICD-10-CM

## 2022-12-20 DIAGNOSIS — Z951 Presence of aortocoronary bypass graft: Secondary | ICD-10-CM

## 2022-12-20 DIAGNOSIS — J439 Emphysema, unspecified: Secondary | ICD-10-CM

## 2022-12-20 DIAGNOSIS — M779 Enthesopathy, unspecified: Secondary | ICD-10-CM

## 2022-12-20 NOTE — Progress Notes
Subjective:       Here to follow-up.  Has plateaued at therapy.  Has also run out of therapy visits.  Says that he has been working around his finger limitations.  Recently has played golf as well has gone flyfishing.  Would like to improve upon his finger mobility, but wants to wait until the warm weather months are over.      Mitchell Krause is a 72 y.o. male.       Review of Systems   Constitutional: Negative.    HENT: Negative.     Eyes: Negative.    Respiratory: Negative.     Cardiovascular: Negative.    Gastrointestinal: Negative.    Endocrine: Negative.    Genitourinary: Negative.    Musculoskeletal: Negative.    Skin: Negative.    Allergic/Immunologic: Negative.    Neurological: Negative.    Hematological: Negative.    Psychiatric/Behavioral: Negative.         Objective:          acetaminophen (ACETAMINOPHEN EXTRA STRENGTH) 500 mg tablet Take two tablets by mouth every 6 hours as needed for Pain. Max of 4,000 mg of acetaminophen in 24 hours.    alirocumab (PRALUENT PEN) 150 mg/mL injectable PEN Inject 1 mL under the skin every 14 days.    aspirin EC 81 mg tablet Take one tablet by mouth at bedtime daily. Take with food.    CHOLEcalciferoL (vitamin D3) (VITAMIN D3) 1,000 units tablet Take six tablets by mouth daily.    fluticasone propionate (FLONASE) 50 mcg/actuation nasal spray, suspension Apply two sprays to each nostril as directed daily as needed.    nitroglycerin (NITROSTAT) 0.4 mg tablet Place one tablet under tongue every 5 minutes as needed for Chest Pain. Max of 3 tablets, call 911.    potassium gluconate 600 mg (99 mg) tab Take one tablet by mouth as Needed.    rosuvastatin (CRESTOR) 10 mg tablet Take one tablet by mouth daily. (Patient taking differently: Take one tablet by mouth every morning.)    sennosides-docusate sodium (SENOKOT-S) 8.6/50 mg tablet Take one tablet by mouth daily.    SPIRIVA RESPIMAT 2.5 mcg/actuation inhaler Inhale one puff by mouth into the lungs daily with lunch. SYMBICORT 160-4.5 mcg/actuation aerosol inhaler Inhale two puffs by mouth into the lungs twice daily.     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  He would like to wait until after the summer.  He also is out of therapy visits, so we need to wait until early next year.  We discussed this in some detail.  He will come back and see [place] sometime in the late fall or early winter to talk about moving forward for sometime in the new year.

## 2022-12-21 ENCOUNTER — Encounter: Admit: 2022-12-21 | Discharge: 2022-12-21 | Payer: MEDICARE

## 2022-12-25 ENCOUNTER — Encounter: Admit: 2022-12-25 | Discharge: 2022-12-25 | Payer: MEDICARE

## 2022-12-27 ENCOUNTER — Encounter: Admit: 2022-12-27 | Discharge: 2022-12-27 | Payer: MEDICARE

## 2022-12-27 MED FILL — PRALUENT PEN 150 MG/ML SC PNIJ: 150 mg/mL | SUBCUTANEOUS | 28 days supply | Qty: 2 | Fill #4 | Status: AC

## 2022-12-29 ENCOUNTER — Encounter: Admit: 2022-12-29 | Discharge: 2022-12-29 | Payer: MEDICARE

## 2023-01-23 ENCOUNTER — Encounter: Admit: 2023-01-23 | Discharge: 2023-01-23 | Payer: MEDICARE

## 2023-01-24 ENCOUNTER — Encounter: Admit: 2023-01-24 | Discharge: 2023-01-24 | Payer: MEDICARE

## 2023-01-25 MED FILL — PRALUENT PEN 150 MG/ML SC PNIJ: 150 mg/mL | SUBCUTANEOUS | 28 days supply | Qty: 2 | Fill #5 | Status: AC

## 2023-02-21 ENCOUNTER — Encounter: Admit: 2023-02-21 | Discharge: 2023-02-21 | Payer: MEDICARE

## 2023-03-03 ENCOUNTER — Encounter: Admit: 2023-03-03 | Discharge: 2023-03-03 | Payer: MEDICARE

## 2023-03-05 ENCOUNTER — Encounter: Admit: 2023-03-05 | Discharge: 2023-03-05 | Payer: MEDICARE

## 2023-03-05 MED FILL — PRALUENT PEN 150 MG/ML SC PNIJ: 150 mg/mL | SUBCUTANEOUS | 28 days supply | Qty: 2 | Fill #6 | Status: AC

## 2023-03-23 ENCOUNTER — Encounter: Admit: 2023-03-23 | Discharge: 2023-03-23 | Payer: MEDICARE

## 2023-03-26 ENCOUNTER — Encounter: Admit: 2023-03-26 | Discharge: 2023-03-26 | Payer: MEDICARE

## 2023-03-26 MED FILL — PRALUENT PEN 150 MG/ML SC PNIJ: 150 mg/mL | SUBCUTANEOUS | 28 days supply | Qty: 2 | Fill #7 | Status: AC

## 2023-04-25 ENCOUNTER — Encounter: Admit: 2023-04-25 | Discharge: 2023-04-25 | Payer: MEDICARE

## 2023-04-27 ENCOUNTER — Encounter: Admit: 2023-04-27 | Discharge: 2023-04-27 | Payer: MEDICARE

## 2023-04-30 ENCOUNTER — Encounter: Admit: 2023-04-30 | Discharge: 2023-04-30 | Payer: MEDICARE

## 2023-04-30 MED FILL — PRALUENT PEN 150 MG/ML SC PNIJ: 150 mg/mL | SUBCUTANEOUS | 28 days supply | Qty: 2 | Fill #8 | Status: AC

## 2023-05-23 ENCOUNTER — Encounter: Admit: 2023-05-23 | Discharge: 2023-05-23 | Payer: MEDICARE

## 2023-05-28 ENCOUNTER — Encounter: Admit: 2023-05-28 | Discharge: 2023-05-28 | Payer: MEDICARE

## 2023-05-28 MED FILL — PRALUENT PEN 150 MG/ML SC PNIJ: 150 mg/mL | SUBCUTANEOUS | 28 days supply | Qty: 2 | Fill #9 | Status: AC

## 2023-07-05 ENCOUNTER — Encounter: Admit: 2023-07-05 | Discharge: 2023-07-05 | Payer: MEDICARE

## 2023-07-10 ENCOUNTER — Encounter: Admit: 2023-07-10 | Discharge: 2023-07-10 | Payer: MEDICARE

## 2023-07-11 ENCOUNTER — Encounter: Admit: 2023-07-11 | Discharge: 2023-07-11 | Payer: MEDICARE

## 2023-07-11 MED FILL — PRALUENT PEN 150 MG/ML SC PNIJ: 150 mg/mL | SUBCUTANEOUS | 28 days supply | Qty: 2 | Fill #10 | Status: AC

## 2023-08-06 ENCOUNTER — Encounter: Admit: 2023-08-06 | Discharge: 2023-08-06 | Payer: MEDICARE

## 2023-08-10 ENCOUNTER — Encounter: Admit: 2023-08-10 | Discharge: 2023-08-10 | Payer: MEDICARE

## 2023-08-11 ENCOUNTER — Encounter: Admit: 2023-08-11 | Discharge: 2023-08-11 | Payer: MEDICARE

## 2023-08-12 ENCOUNTER — Encounter: Admit: 2023-08-12 | Discharge: 2023-08-12 | Payer: MEDICARE

## 2023-08-12 MED FILL — PRALUENT PEN 150 MG/ML SC PNIJ: 150 mg/mL | SUBCUTANEOUS | 28 days supply | Qty: 2 | Fill #11 | Status: AC

## 2023-09-05 ENCOUNTER — Encounter: Admit: 2023-09-05 | Discharge: 2023-09-05 | Payer: MEDICARE

## 2023-09-11 ENCOUNTER — Encounter: Admit: 2023-09-11 | Discharge: 2023-09-11 | Payer: MEDICARE

## 2023-09-21 ENCOUNTER — Encounter: Admit: 2023-09-21 | Discharge: 2023-09-21 | Payer: MEDICARE

## 2023-09-25 ENCOUNTER — Encounter: Admit: 2023-09-25 | Discharge: 2023-09-25 | Payer: MEDICARE

## 2023-09-26 ENCOUNTER — Encounter: Admit: 2023-09-26 | Discharge: 2023-09-26 | Payer: MEDICARE

## 2023-09-27 MED FILL — PRALUENT PEN 150 MG/ML SC PNIJ: 150 mg/mL | SUBCUTANEOUS | 28 days supply | Qty: 2 | Fill #1 | Status: AC

## 2023-10-19 ENCOUNTER — Encounter: Admit: 2023-10-19 | Discharge: 2023-10-19 | Payer: MEDICARE

## 2023-10-23 ENCOUNTER — Encounter: Admit: 2023-10-23 | Discharge: 2023-10-23 | Payer: MEDICARE

## 2023-10-27 ENCOUNTER — Encounter: Admit: 2023-10-27 | Discharge: 2023-10-27

## 2023-10-27 NOTE — Progress Notes
 Pharmacy Benefits Investigation    Medication name: alirocumab (PRALUENT PEN) 150 mg/mL injectable PEN  Medication status: continuation (refill)    The insurance requires a prior authorization for the medication. The prior authorization was submitted via CoverMyMeds.    PA number: Wynona Neat  Specialty Pharmacy Patient Advocate

## 2023-11-02 ENCOUNTER — Encounter: Admit: 2023-11-02 | Discharge: 2023-11-02

## 2023-11-05 ENCOUNTER — Encounter: Admit: 2023-11-05 | Discharge: 2023-11-05

## 2023-11-06 ENCOUNTER — Encounter: Admit: 2023-11-06 | Discharge: 2023-11-06

## 2023-11-06 MED FILL — PRALUENT PEN 150 MG/ML SC PNIJ: 150 mg/mL | SUBCUTANEOUS | 28 days supply | Qty: 2 | Fill #2 | Status: AC

## 2023-11-09 ENCOUNTER — Encounter: Admit: 2023-11-09 | Discharge: 2023-11-09 | Payer: MEDICARE

## 2023-11-09 NOTE — Progress Notes
 Pharmacy Benefits Investigation    Medication name: alirocumab  (PRALUENT  PEN) 150 mg/mL injectable PEN  Medication status: continuation (refill)    The out of pocket cost today is $116.57. This cost may change due to factors including but not limited to changes in insurance coverage.    A grant is available through [employer].    After assistance, the final out of pocket cost is $0.    The copay is affordable. Per patient's request, the medication will be shipped to the patient's address.    Thurnell Floss  Specialty Pharmacy Patient Advocate

## 2023-11-24 ENCOUNTER — Encounter: Admit: 2023-11-24 | Discharge: 2023-11-24 | Payer: MEDICARE

## 2023-11-26 ENCOUNTER — Encounter: Admit: 2023-11-26 | Discharge: 2023-11-26 | Payer: MEDICARE

## 2023-11-26 MED FILL — PRALUENT PEN 150 MG/ML SC PNIJ: 150 mg/mL | SUBCUTANEOUS | 28 days supply | Qty: 2 | Fill #3 | Status: AC

## 2023-12-13 IMAGING — CT LOW_DOSE_LUNG(Adult)
3 of 9 series · 15 of 36 positions shown, 17 images · non-contrast
Comparison: none

[Series 2: lung ax 1.00 br44 s3 · axial · 0.67mm/px · z∈[+1541,+1786]mm · 6 of 344 slices shown]
[im 50/344  lung]
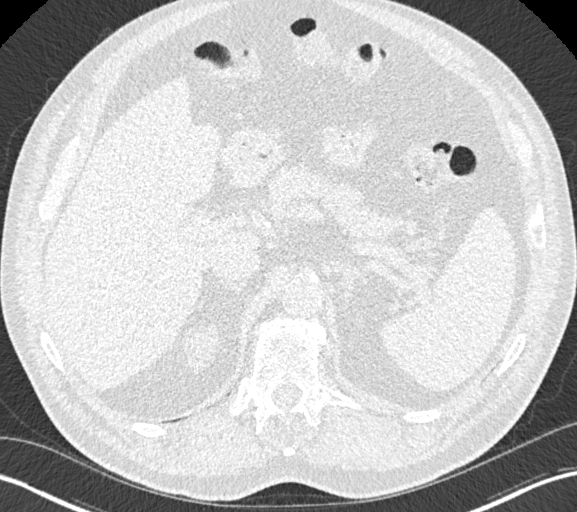
[im 99/344  lung]
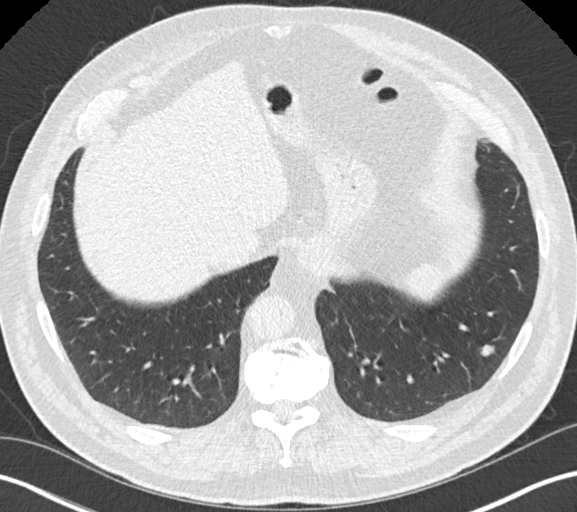
[im 148/344  lung]
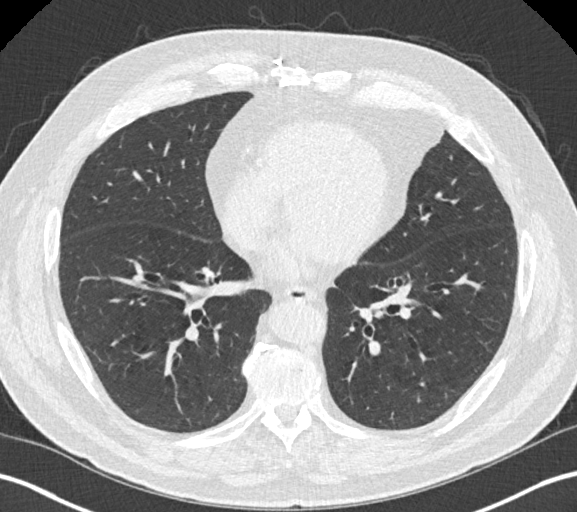
[im 197/344  lung]
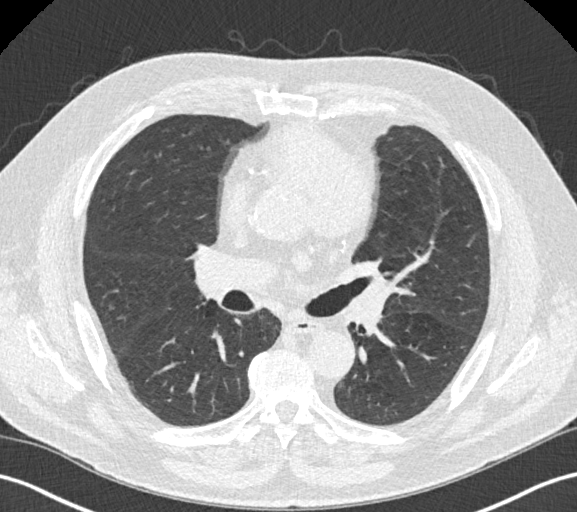
[im 246/344  lung]
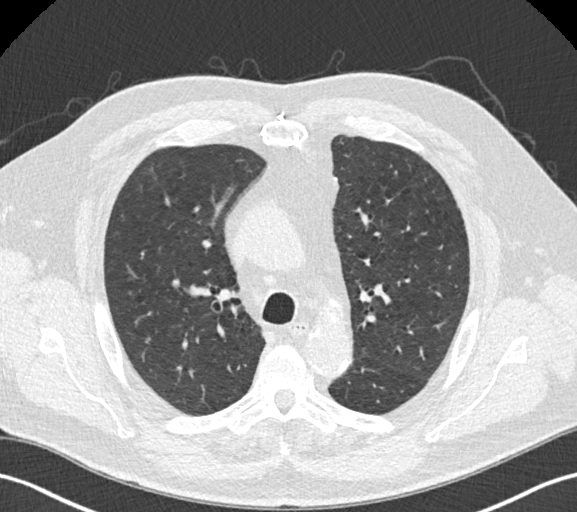
[im 295/344  lung]
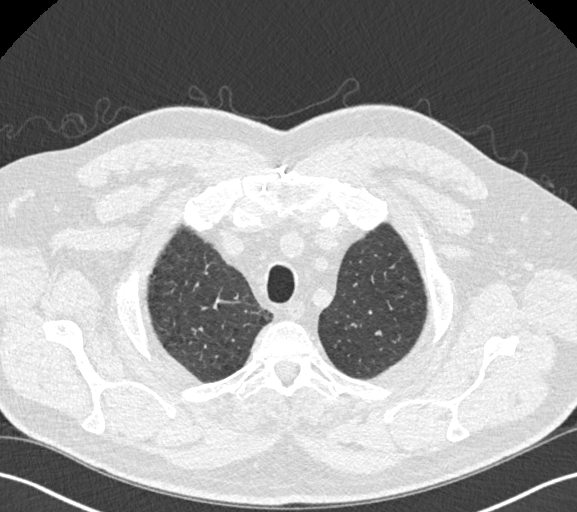

[Series 10: lung cor 1.00 br44 s3 · coronal · 0.67mm/px · 1 of 344 slices shown]
[im 172/344  lung]
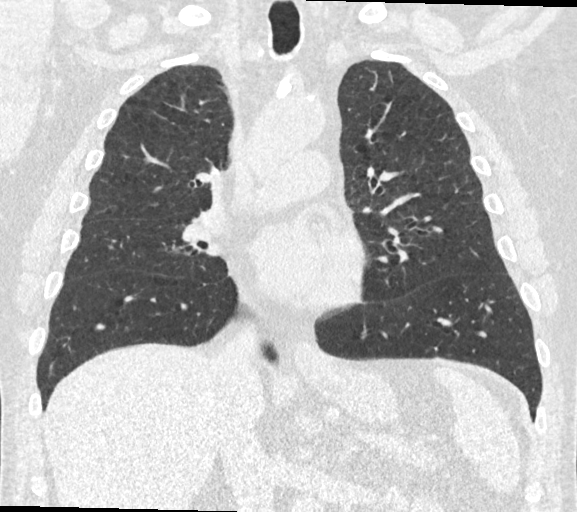

[Series 17: lung 1.00 br60 s3 cad · axial · 0.76mm/px · z∈[+1564,+1803]mm · 8 of 485 slices shown, 10 images]
[im 97/485  mediastinal]
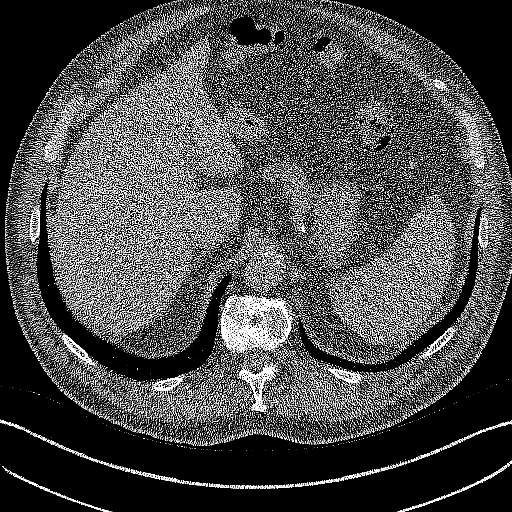
[im 97/485  lung]
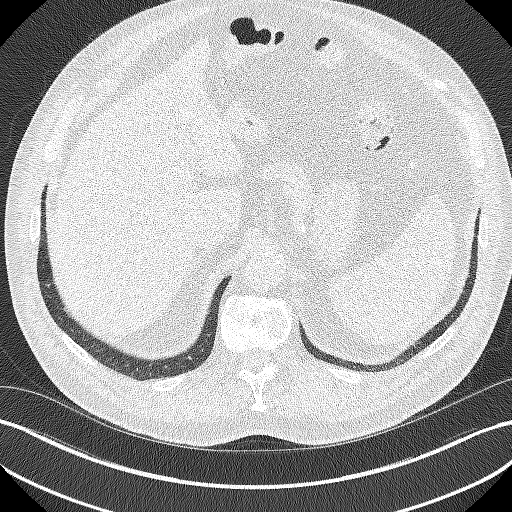
[im 146/485  lung]
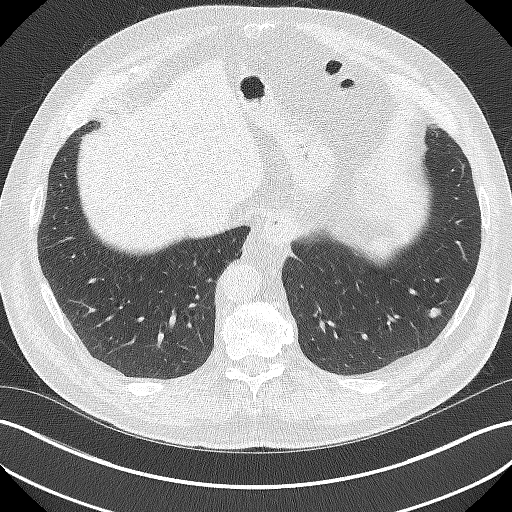
[im 194/485  lung]
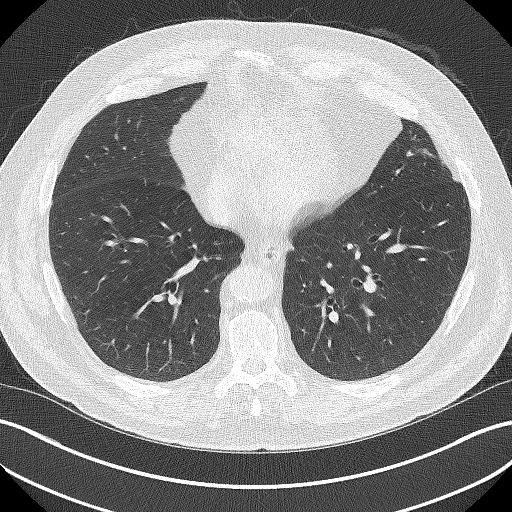
[im 243/485  lung]
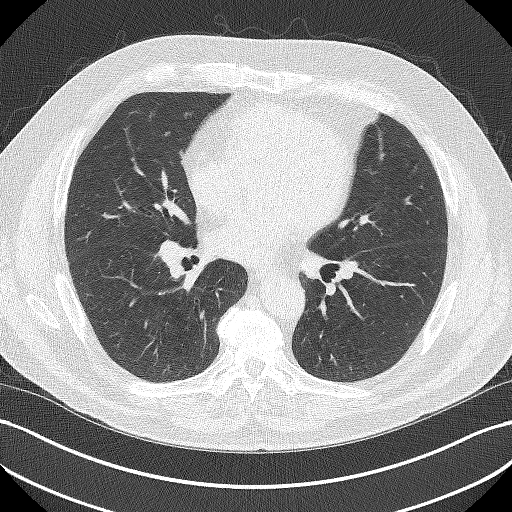
[im 291/485  mediastinal]
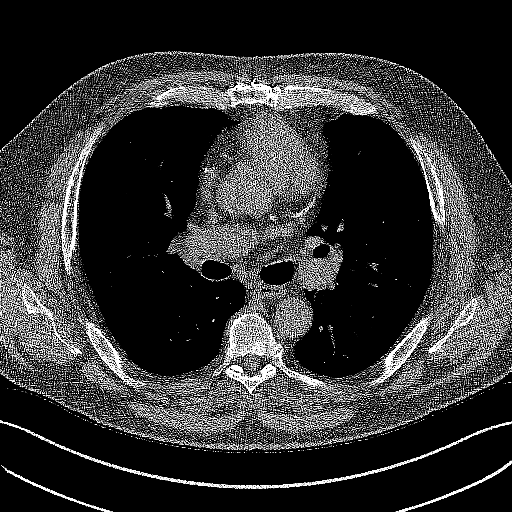
[im 291/485  lung]
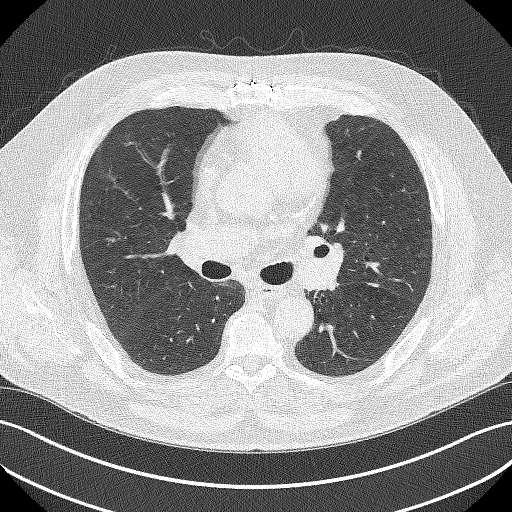
[im 339/485  lung]
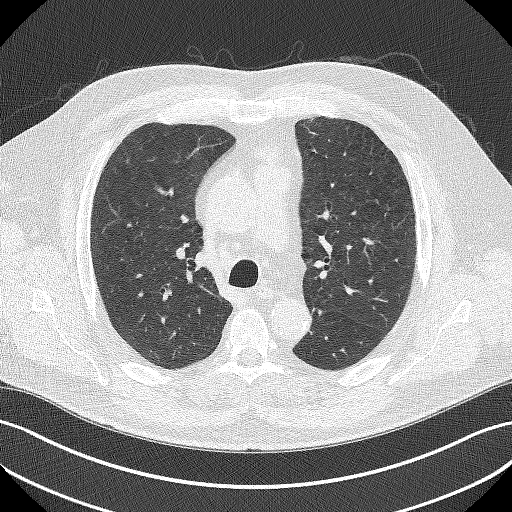
[im 388/485  lung]
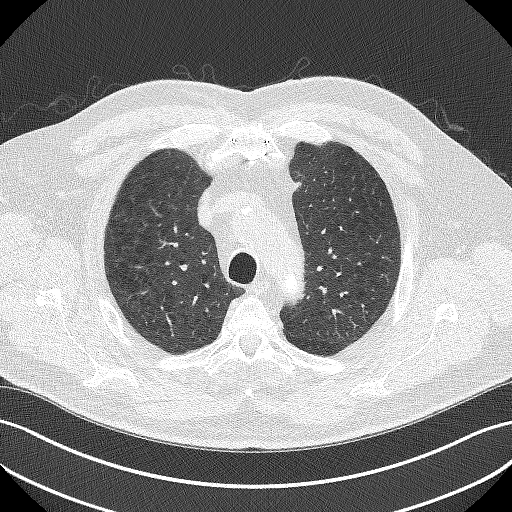
[im 436/485  lung]
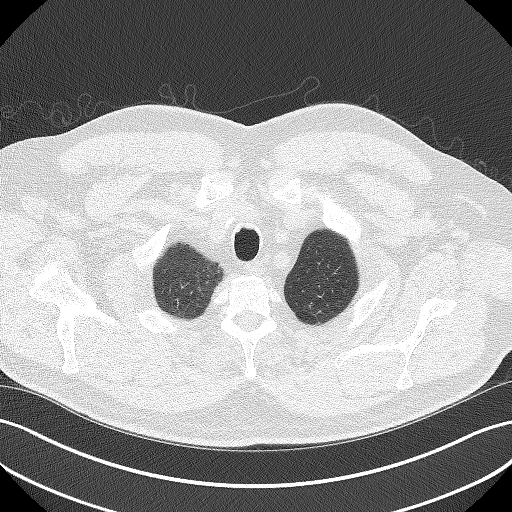

[15 of 36 positions shown; findings below may reference images not displayed]

DIAGNOSTIC STUDIES

EXAM

CT low-dose lung screening.

INDICATION

PT STATES HX OF FORMER SMOKER X 1 YEAR AGO. HX OF SMOKING 1 PACK PER DAY X 50 YEARS. CT/NM 0/0. TJ

TECHNIQUE

All CT scans at this facility use dose modulation, iterative reconstruction, and/or weight based
dosing when appropriate to reduce radiation dose to as low as reasonably achievable.

Number of previous computed tomography exams in the last 12 months is 0  .

Number of previous nuclear medicine myocardial perfusion studies in the last 12 months is 0  .

COMPARISONS

August 05, 2020

FINDINGS

Low-dose technique is limited in evaluation of mediastinal and subdiaphragmatic structures. There
is dense calcification of the coronary vessels and prior sternotomy.

Emphysematous changes are noted throughout the lungs. There is mild bronchial wall thickening and
bronchiectasis.
A 1 x 0.6 cm left lower lobe pulmonary nodule is noted image 247 series 2. This is unchanged from
prior exam. Previously described 4 millimeter nodule also in the left lung base is well seen on
current exam. Tiny calcified granuloma right lung base is unchanged. Small focus of nodule
thickening along the left major fissure is stable image 144 series 2. Additional tiny sub 5
millimeter nodules noted previously are also unchanged. No new nodules are seen. No concerning
osseous lesions.

IMPRESSION

Stable low-dose lung screening. Repeat exam in 1 year is recommended. Lung rads 2.

Tech Notes:

PT STATES HX OF FORMER SMOKER X 1 YEAR AGO. HX OF SMOKING 1 PACK PER DAY X 50 YEARS. CT/NM 0/0. TJ

## 2024-01-03 ENCOUNTER — Encounter: Admit: 2024-01-03 | Discharge: 2024-01-03 | Payer: MEDICARE

## 2024-01-04 ENCOUNTER — Encounter: Admit: 2024-01-04 | Discharge: 2024-01-04 | Payer: MEDICARE

## 2024-01-04 MED FILL — PRALUENT PEN 150 MG/ML SC PNIJ: 150 mg/mL | SUBCUTANEOUS | 28 days supply | Qty: 2 | Fill #4 | Status: AC

## 2024-02-29 ENCOUNTER — Encounter: Admit: 2024-02-29 | Discharge: 2024-02-29 | Payer: MEDICARE

## 2024-02-29 MED ORDER — PRALUENT PEN 150 MG/ML SC PNIJ
150 mg | SUBCUTANEOUS | 1 refills
Start: 2024-02-29 — End: ?

## 2024-07-30 ENCOUNTER — Encounter: Admit: 2024-07-30 | Discharge: 2024-07-30 | Payer: MEDICARE
